# Patient Record
Sex: Female | Born: 1975 | Race: White | Hispanic: No | Marital: Married | Smoking: Former smoker
Health system: Southern US, Community
[De-identification: ages and names within clinical notes are randomized; demographics above are authoritative.]

## PROBLEM LIST (undated history)

## (undated) DIAGNOSIS — B009 Herpesviral infection, unspecified: Secondary | ICD-10-CM

## (undated) DIAGNOSIS — N200 Calculus of kidney: Secondary | ICD-10-CM

## (undated) HISTORY — PX: NO PAST SURGERIES: SHX2092

## (undated) HISTORY — DX: Calculus of kidney: N20.0

## (undated) HISTORY — DX: Herpesviral infection, unspecified: B00.9

---

## 2015-07-10 LAB — HM PAP SMEAR: HM Pap smear: NORMAL

## 2015-10-24 DIAGNOSIS — N3000 Acute cystitis without hematuria: Secondary | ICD-10-CM | POA: Diagnosis not present

## 2015-10-24 DIAGNOSIS — A6000 Herpesviral infection of urogenital system, unspecified: Secondary | ICD-10-CM | POA: Diagnosis not present

## 2015-10-24 DIAGNOSIS — R300 Dysuria: Secondary | ICD-10-CM | POA: Diagnosis not present

## 2015-10-24 DIAGNOSIS — N898 Other specified noninflammatory disorders of vagina: Secondary | ICD-10-CM | POA: Diagnosis not present

## 2017-07-08 ENCOUNTER — Ambulatory Visit (INDEPENDENT_AMBULATORY_CARE_PROVIDER_SITE_OTHER): Payer: BLUE CROSS/BLUE SHIELD | Admitting: Obstetrics and Gynecology

## 2017-07-08 ENCOUNTER — Encounter: Payer: Self-pay | Admitting: Obstetrics and Gynecology

## 2017-07-08 VITALS — BP 128/84 | Ht 63.0 in | Wt 153.0 lb

## 2017-07-08 DIAGNOSIS — Z1331 Encounter for screening for depression: Secondary | ICD-10-CM

## 2017-07-08 DIAGNOSIS — Z01419 Encounter for gynecological examination (general) (routine) without abnormal findings: Secondary | ICD-10-CM | POA: Diagnosis not present

## 2017-07-08 DIAGNOSIS — Z1339 Encounter for screening examination for other mental health and behavioral disorders: Secondary | ICD-10-CM | POA: Diagnosis not present

## 2017-07-08 DIAGNOSIS — N946 Dysmenorrhea, unspecified: Secondary | ICD-10-CM | POA: Diagnosis not present

## 2017-07-08 NOTE — Progress Notes (Signed)
Gynecology Annual Exam   PCP: Patient, No Pcp Per  Chief Complaint  Patient presents with  . Annual Exam   History of Present Illness:  Ms. Cinnamon Morency is a 42 y.o. [id number] who LMP was Patient's last menstrual period was 07/01/2017., presents today for her annual examination.  Her menses are regular every 28-30 days (more recently 3 weeks since her last one), lasting 5 day(s).  Dysmenorrhea none. She does not have intermenstrual bleeding.  She has migraines during her periods and has migraines.   She is single partner, contraception - none.  Last Pap: 07/10/2015  Results were: no abnormalities /neg HPV DNA negative Hx of STDs: HSV  Last mammogram: several years ago  Results were: normal--routine follow-up in 12 months There is no FH of breast cancer. There is no FH of ovarian cancer. The patient does not do self-breast exams.  Tobacco use: The patient denies current or previous tobacco use. Alcohol use: social drinker Exercise: moderately active  The patient wears seatbelts: yes.      Past Medical History:  Diagnosis Date  . Herpes simplex    Past Surgical History: denies  Prior to Admission medications   denies   Allergies: No Known Allergies  Gynecologic History: Patient's last menstrual period was 07/01/2017.  Obstetric History: [id number]  Social History   Socioeconomic History  . Marital status: Married    Spouse name: Not on file  . Number of children: Not on file  . Years of education: Not on file  . Highest education level: Not on file  Social Needs  . Financial resource strain: Not on file  . Food insecurity - worry: Not on file  . Food insecurity - inability: Not on file  . Transportation needs - medical: Not on file  . Transportation needs - non-medical: Not on file  Occupational History  . Not on file  Tobacco Use  . Smoking status: Never Smoker  . Smokeless tobacco: Never Used  Substance and Sexual Activity  . Alcohol use: Yes  . Drug  use: No  . Sexual activity: Yes    Birth control/protection: None  Other Topics Concern  . Not on file  Social History Narrative  . Not on file    Family History  Problem Relation Age of Onset  . Skin cancer Paternal Grandfather     Review of Systems  Constitutional: Negative.   HENT: Negative.   Eyes: Negative.   Respiratory: Negative.   Cardiovascular: Negative.   Gastrointestinal: Negative.   Genitourinary: Negative.   Musculoskeletal: Negative.   Skin: Negative.   Neurological: Negative.   Psychiatric/Behavioral: Negative.      Physical Exam BP 128/84   Ht 5\' 3"  (1.6 m)   Wt 153 lb (69.4 kg)   LMP 07/01/2017   BMI 27.10 kg/m    Physical Exam  Constitutional: She is oriented to person, place, and time. She appears well-developed and well-nourished. No distress.  Genitourinary: Uterus normal. Pelvic exam was performed with patient supine. There is no rash, tenderness, lesion or injury on the right labia. There is no rash, tenderness, lesion or injury on the left labia. No erythema, tenderness or bleeding in the vagina. No signs of injury around the vagina. No vaginal discharge found. Right adnexum does not display mass, does not display tenderness and does not display fullness. Left adnexum does not display mass, does not display tenderness and does not display fullness. Cervix does not exhibit motion tenderness, lesion, discharge or polyp.  Uterus is mobile and anteverted. Uterus is not enlarged, tender or exhibiting a mass.  HENT:  Head: Normocephalic and atraumatic.  Eyes: EOM are normal. No scleral icterus.  Neck: Normal range of motion. Neck supple. No thyromegaly present.  Cardiovascular: Normal rate and regular rhythm. Exam reveals no gallop and no friction rub.  No murmur heard. Pulmonary/Chest: Effort normal and breath sounds normal. No respiratory distress. She has no wheezes. She has no rales. Right breast exhibits no inverted nipple, no mass, no nipple  discharge, no skin change and no tenderness. Left breast exhibits no inverted nipple, no mass, no nipple discharge, no skin change and no tenderness.  Abdominal: Soft. Bowel sounds are normal. She exhibits no distension and no mass. There is no tenderness. There is no rebound and no guarding.  Musculoskeletal: Normal range of motion. She exhibits no edema or tenderness.  Lymphadenopathy:    She has no cervical adenopathy.       Right: No inguinal adenopathy present.       Left: No inguinal adenopathy present.  Neurological: She is alert and oriented to person, place, and time. No cranial nerve deficit.  Skin: Skin is warm and dry. No rash noted. No erythema.  Psychiatric: She has a normal mood and affect. Her behavior is normal. Judgment normal.    Female chaperone present for pelvic and breast  portions of the physical exam  Results: AUDIT Questionnaire (screen for alcoholism): 3 PHQ-9: 10 (0 on #9)   Assessment: 42 y.o. [id number] female here for routine annual gynecologic examination.  Plan: Problem List Items Addressed This Visit    None      Screening: -- Blood pressure screen normal -- Colonoscopy - not due -- Mammogram - due. Patient to call [facility] to arrange. She understands that it is her responsibility to arrange this. She should do this once she turns 42.  -- Weight screening: overweight: continue to monitor -- Depression screening negative (PHQ-9) -- Nutrition: normal -- cholesterol screening: not due for screening -- osteoporosis screening: not due -- tobacco screening: not using -- alcohol screening: AUDIT questionnaire indicates low-risk usage. -- family history of breast cancer screening: done. not at high risk. -- no evidence of domestic violence or intimate partner violence. -- STD screening: gonorrhea/chlamydia NAAT not collected per patient request. -- pap smear not collected per ASCCP guidelines -- flu vaccine declined. Does not normally get -- HPV  vaccination series: not eligilbe  Dysmenorrhea: discussed treatment options. She does not want anything with hormones (estrogen particularly). DIscussed the mirena IUD. She declines for now, but will consider it.   Thomasene MohairStephen Mckenzey Parcell, MD 07/08/2017 11:03 AM

## 2017-08-01 DIAGNOSIS — R1031 Right lower quadrant pain: Secondary | ICD-10-CM | POA: Diagnosis not present

## 2017-08-01 DIAGNOSIS — Z3202 Encounter for pregnancy test, result negative: Secondary | ICD-10-CM | POA: Diagnosis not present

## 2017-08-01 DIAGNOSIS — R300 Dysuria: Secondary | ICD-10-CM | POA: Diagnosis not present

## 2017-08-01 DIAGNOSIS — G44029 Chronic cluster headache, not intractable: Secondary | ICD-10-CM | POA: Diagnosis not present

## 2017-08-02 ENCOUNTER — Emergency Department: Payer: BLUE CROSS/BLUE SHIELD

## 2017-08-02 ENCOUNTER — Other Ambulatory Visit: Payer: Self-pay

## 2017-08-02 ENCOUNTER — Encounter: Payer: Self-pay | Admitting: Emergency Medicine

## 2017-08-02 ENCOUNTER — Emergency Department
Admission: EM | Admit: 2017-08-02 | Discharge: 2017-08-02 | Disposition: A | Discharge status: Home / Self Care | Payer: BLUE CROSS/BLUE SHIELD | Attending: Emergency Medicine | Admitting: Emergency Medicine

## 2017-08-02 DIAGNOSIS — R1031 Right lower quadrant pain: Secondary | ICD-10-CM | POA: Insufficient documentation

## 2017-08-02 DIAGNOSIS — R102 Pelvic and perineal pain: Secondary | ICD-10-CM | POA: Diagnosis not present

## 2017-08-02 DIAGNOSIS — N200 Calculus of kidney: Secondary | ICD-10-CM

## 2017-08-02 DIAGNOSIS — R109 Unspecified abdominal pain: Secondary | ICD-10-CM

## 2017-08-02 DIAGNOSIS — R110 Nausea: Secondary | ICD-10-CM | POA: Diagnosis not present

## 2017-08-02 HISTORY — DX: Calculus of kidney: N20.0

## 2017-08-02 LAB — COMPREHENSIVE METABOLIC PANEL
ALT: 26 U/L (ref 14–54)
AST: 31 U/L (ref 15–41)
Albumin: 4.6 g/dL (ref 3.5–5.0)
Alkaline Phosphatase: 67 U/L (ref 38–126)
Anion gap: 8 (ref 5–15)
BUN: 18 mg/dL (ref 6–20)
CO2: 26 mmol/L (ref 22–32)
Calcium: 9.3 mg/dL (ref 8.9–10.3)
Chloride: 103 mmol/L (ref 101–111)
Creatinine, Ser: 0.68 mg/dL (ref 0.44–1.00)
Glucose, Bld: 100 mg/dL — ABNORMAL HIGH (ref 65–99)
POTASSIUM: 3.9 mmol/L (ref 3.5–5.1)
Sodium: 137 mmol/L (ref 135–145)
Total Bilirubin: 0.5 mg/dL (ref 0.3–1.2)
Total Protein: 7.7 g/dL (ref 6.5–8.1)

## 2017-08-02 LAB — URINALYSIS, COMPLETE (UACMP) WITH MICROSCOPIC
BACTERIA UA: NONE SEEN
BILIRUBIN URINE: NEGATIVE
Glucose, UA: NEGATIVE mg/dL
Hgb urine dipstick: NEGATIVE
KETONES UR: NEGATIVE mg/dL
Nitrite: NEGATIVE
PROTEIN: NEGATIVE mg/dL
Specific Gravity, Urine: 1.021 (ref 1.005–1.030)
pH: 5 (ref 5.0–8.0)

## 2017-08-02 LAB — CBC
HEMATOCRIT: 41.5 % (ref 35.0–47.0)
Hemoglobin: 14 g/dL (ref 12.0–16.0)
MCH: 29.9 pg (ref 26.0–34.0)
MCHC: 33.8 g/dL (ref 32.0–36.0)
MCV: 88.5 fL (ref 80.0–100.0)
Platelets: 241 10*3/uL (ref 150–440)
RBC: 4.69 MIL/uL (ref 3.80–5.20)
RDW: 14.1 % (ref 11.5–14.5)
WBC: 6.3 10*3/uL (ref 3.6–11.0)

## 2017-08-02 LAB — LIPASE, BLOOD: Lipase: 34 U/L (ref 11–51)

## 2017-08-02 LAB — POCT PREGNANCY, URINE: Preg Test, Ur: NEGATIVE

## 2017-08-02 MED ORDER — ONDANSETRON 4 MG PO TBDP: 4.0000 mg | ORAL_TABLET | Freq: Three times a day (TID) | ORAL | 0 refills | Status: DC | PRN

## 2017-08-02 MED ORDER — DIPHENHYDRAMINE HCL 50 MG/ML IJ SOLN
25.0000 mg | Freq: Once | INTRAMUSCULAR | Status: AC
  Administered 2017-08-02: 25 mg via INTRAVENOUS

## 2017-08-02 MED ORDER — METHYLPREDNISOLONE SODIUM SUCC 125 MG IJ SOLR
INTRAMUSCULAR | Status: AC
  Administered 2017-08-02: 125 mg via INTRAVENOUS
  Filled 2017-08-02: qty 2

## 2017-08-02 MED ORDER — DIPHENHYDRAMINE HCL 50 MG/ML IJ SOLN
INTRAMUSCULAR | Status: AC
  Administered 2017-08-02: 25 mg via INTRAVENOUS
  Filled 2017-08-02: qty 1

## 2017-08-02 MED ORDER — METHYLPREDNISOLONE SODIUM SUCC 125 MG IJ SOLR
125.0000 mg | Freq: Once | INTRAMUSCULAR | Status: AC
  Administered 2017-08-02: 125 mg via INTRAVENOUS

## 2017-08-02 MED ORDER — IOPAMIDOL (ISOVUE-300) INJECTION 61%
100.0000 mL | Freq: Once | INTRAVENOUS | Status: AC | PRN
  Administered 2017-08-02: 100 mL via INTRAVENOUS

## 2017-08-02 NOTE — ED Notes (Signed)
Pt to CT via wheel chair with CT Tech

## 2017-08-02 NOTE — Discharge Instructions (Addendum)
Please follow-up with your primary care physician for further evaluation of your abdominal pain.

## 2017-08-02 NOTE — ED Triage Notes (Addendum)
Pt reports urinary urgency and frequency x 2 weeks; seen at urgent care earlier today and urine was clean; pt says pain now to right lower quadrant and right lower back; provider [facility] but when pt got there they had no order; pt says she was feeling some better so went home; pt here tonight as pain continues; c/o urinary urgency;

## 2017-08-02 NOTE — ED Provider Notes (Signed)
[facility] Emergency Department Provider Note   ____________________________________________   First MD Initiated Contact with Patient 08/02/17 [phone]     (approximate)  I have reviewed the triage vital signs and the nursing notes.   HISTORY  Chief Complaint Abdominal Pain    HPI Paula Arias is a 42 y.o. female who comes into the hospital today with some right lower quadrant abdominal pain.  The patient states that she thought she had a UTI.  She is been having some urinary frequency and some flank pain.  She went to urgent care and they dipped her urine it was negative.  She reports that she was uncomfortable in her right lower quadrant with some back pain so they sent her to [facility] to receive a CT scan but the doctor did not call in any orders.  The patient went home and reports that she tried to go to bed but she was uncomfortable while laying flat.  She also was having some cold sweats and some nausea so she decided to come in to get checked out here.  The patient rates her pain a 6 out of 10 in intensity currently.  Her last menstrual period was March 21 and it was normal.  The patient feels like she has a urinary tract infection but she does have a history of kidney stones.  She is here today for evaluation of her symptoms.   Past Medical History:  Diagnosis Date  . Herpes simplex     There are no active problems to display for this patient.   History reviewed. No pertinent surgical history.  Prior to Admission medications   Medication Sig Start Date End Date Taking? Authorizing Provider  ondansetron (ZOFRAN ODT) 4 MG disintegrating tablet Take 1 tablet (4 mg total) by mouth every 8 (eight) hours as needed for nausea or vomiting. 08/02/17   Rebecka Apley, MD    Allergies Isovue [iopamidol]  Family History  Problem Relation Age of Onset  . Skin cancer Paternal Grandfather     Social History Social History   Tobacco  Use  . Smoking status: Never Smoker  . Smokeless tobacco: Never Used  Substance Use Topics  . Alcohol use: Yes  . Drug use: No    Review of Systems  Constitutional: Cold sweats Eyes: No visual changes. ENT: No sore throat. Cardiovascular: Denies chest pain. Respiratory: Denies shortness of breath. Gastrointestinal: abdominal pain.   nausea, no vomiting.  No diarrhea.  No constipation. Genitourinary: Negative for dysuria. Musculoskeletal: Negative for back pain. Skin: Negative for rash. Neurological: Negative for headaches, focal weakness or numbness.   ____________________________________________   PHYSICAL EXAM:  VITAL SIGNS: ED Triage Vitals  Enc Vitals Group     BP 08/02/17 0215 (!) 152/99     Pulse Rate 08/02/17 0215 86     Resp 08/02/17 0215 16     Temp 08/02/17 0215 97.7 F (36.5 C)     Temp Source 08/02/17 0215 Oral     SpO2 08/02/17 0215 100 %     Weight 08/02/17 0215 150 lb (68 kg)     Height 08/02/17 0215 5\' 3"  (1.6 m)     Head Circumference --      Peak Flow --      Pain Score 08/02/17 0225 6     Pain Loc --      Pain Edu? --      Excl. in GC? --     Constitutional: Alert and  oriented. Well appearing and in mild distress. Eyes: Conjunctivae are normal. PERRL. EOMI. Head: Atraumatic. Nose: No congestion/rhinnorhea. Mouth/Throat: Mucous membranes are moist.  Oropharynx non-erythematous. Cardiovascular: Normal rate, regular rhythm. Grossly normal heart sounds.  Good peripheral circulation. Respiratory: Normal respiratory effort.  No retractions. Lungs CTAB. Gastrointestinal: Soft with some Rlq abd pain. No distention.  Positive bowel sounds Genitourinary: Patient declined pelvic exam as had one 2 weeks ago. Musculoskeletal: No lower extremity tenderness nor edema.   Neurologic:  Normal speech and language.  Skin:  Skin is warm, dry and intact.  Psychiatric: Mood and affect are normal.   ____________________________________________   LABS (all  labs ordered are listed, but only abnormal results are displayed)  Labs Reviewed  COMPREHENSIVE METABOLIC PANEL - Abnormal; Notable for the following components:      Result Value   Glucose, Bld 100 (*)    All other components within normal limits  URINALYSIS, COMPLETE (UACMP) WITH MICROSCOPIC - Abnormal; Notable for the following components:   Color, Urine YELLOW (*)    APPearance HAZY (*)    Leukocytes, UA TRACE (*)    Squamous Epithelial / LPF 0-5 (*)    All other components within normal limits  CBC  LIPASE, BLOOD  POC URINE PREG, ED  POCT PREGNANCY, URINE   ____________________________________________  EKG  none ____________________________________________  RADIOLOGY  ED MD interpretation:  CT abdomen and pelvis: Small nonobstructing right renal calculi, no hydronephrosis, No small bowel obstruction, normal appendix, A 1.5 cm dominant right ovarian follicle  Ultrasound pelvis: Physiologic follicles in the ovaries, no pelvic mass beyond physiologic follicles, no ovarian torsion on either side.  Official radiology report(s): US Pelvis Transvanginal Non-ob (tv Only)  Result Date: 08/02/2017 CLINICAL DATA:  Pelvic pain EXAM: TRANSABDOMINAL AND TRANSVAGINAL ULTRASOUND OF PELVIS DOPPLER ULTRASOUND OF OVARIES TECHNIQUE: Study was performed transabdominally to optimize pelvic field of view evaluation and transvaginally to optimize internal visceral architecture evaluation. Color and duplex Doppler ultrasound was utilized to evaluate blood flow to the ovaries. COMPARISON:  CT abdomen and pelvis August 02, 2017 FINDINGS: Uterus Measurements: 8.1 x 4.6 x 4.8 cm. No fibroids or other mass visualized. Endometrium Thickness: 8 mm.  No focal abnormality visualized. Right ovary Measurements: 2.9 x 2.5 x 2.0 cm. There is a 2.0 x 1.6 x 1.1 cm presumed dominant follicle right ovary. No other extrauterine pelvic or adnexal mass on the right. Left ovary Measurements: 2.9 x 1.5 x 2.0 cm. Normal  appearance/no adnexal mass. There is a 1 x 1 cm follicle in left ovary. Pulsed Doppler evaluation of both ovaries demonstrates normal low-resistance arterial and venous waveforms. Other findings No abnormal free fluid. IMPRESSION: Physiologic follicles in the ovaries. No pelvic mass beyond physiologic follicles. No ovarian torsion on either side. Uterus and endometrium appear normal. No inflammatory foci evident. Electronically Signed   By: Bretta Bang III M.D.   On: 08/02/2017 07:25   US Pelvis Complete  Result Date: 08/02/2017 CLINICAL DATA:  Pelvic pain EXAM: TRANSABDOMINAL AND TRANSVAGINAL ULTRASOUND OF PELVIS DOPPLER ULTRASOUND OF OVARIES TECHNIQUE: Study was performed transabdominally to optimize pelvic field of view evaluation and transvaginally to optimize internal visceral architecture evaluation. Color and duplex Doppler ultrasound was utilized to evaluate blood flow to the ovaries. COMPARISON:  CT abdomen and pelvis August 02, 2017 FINDINGS: Uterus Measurements: 8.1 x 4.6 x 4.8 cm. No fibroids or other mass visualized. Endometrium Thickness: 8 mm.  No focal abnormality visualized. Right ovary Measurements: 2.9 x 2.5 x 2.0 cm. There is a  2.0 x 1.6 x 1.1 cm presumed dominant follicle right ovary. No other extrauterine pelvic or adnexal mass on the right. Left ovary Measurements: 2.9 x 1.5 x 2.0 cm. Normal appearance/no adnexal mass. There is a 1 x 1 cm follicle in left ovary. Pulsed Doppler evaluation of both ovaries demonstrates normal low-resistance arterial and venous waveforms. Other findings No abnormal free fluid. IMPRESSION: Physiologic follicles in the ovaries. No pelvic mass beyond physiologic follicles. No ovarian torsion on either side. Uterus and endometrium appear normal. No inflammatory foci evident. Electronically Signed   By: Bretta BangWilliam  Woodruff III M.D.   On: 08/02/2017 07:25   Ct Abdomen Pelvis W Contrast  Result Date: 08/02/2017 CLINICAL DATA:  42 year old female with abdominal  pain. Concern for acute appendicitis. EXAM: CT ABDOMEN AND PELVIS WITH CONTRAST TECHNIQUE: Multidetector CT imaging of the abdomen and pelvis was performed using the standard protocol following bolus administration of intravenous contrast. CONTRAST:  100mL ISOVUE-300 IOPAMIDOL (ISOVUE-300) INJECTION 61% COMPARISON:  None. FINDINGS: Lower chest: The visualized lung bases are clear. No intra-abdominal free air or free fluid. Hepatobiliary: No focal liver abnormality is seen. No gallstones, gallbladder wall thickening, or biliary dilatation. Pancreas: Unremarkable. No pancreatic ductal dilatation or surrounding inflammatory changes. Spleen: Normal in size without focal abnormality. Adrenals/Urinary Tract: The adrenal glands are unremarkable. There is a 3 cm right renal cyst. Punctate nonobstructing right renal interpolar stone as well as a 3 mm nonobstructing inferior pole calculus. There is no hydronephrosis. The left kidney is unremarkable. The visualized ureters and urinary bladder appear unremarkable. Stomach/Bowel: There is no bowel obstruction or active inflammation. Normal appendix. Vascular/Lymphatic: No significant vascular findings are present. No enlarged abdominal or pelvic lymph nodes. Reproductive: The uterus is anteverted and grossly unremarkable. There is a 1.5 cm right ovarian dominant follicle. The left ovary is unremarkable. Other: None Musculoskeletal: No acute or significant osseous findings. IMPRESSION: 1. Small nonobstructing right renal calculi.  No hydronephrosis. 2. No bowel obstruction or active inflammation.  Normal appendix. 3. A 1.5 cm dominant right ovarian follicle. Electronically Signed   By: Elgie CollardArash  Radparvar M.D.   On: 08/02/2017 05:53   [place] Abdominal Pelvic Art/vent Flow Doppler  Result Date: 08/02/2017 CLINICAL DATA:  Pelvic pain EXAM: TRANSABDOMINAL AND TRANSVAGINAL ULTRASOUND OF PELVIS DOPPLER ULTRASOUND OF OVARIES TECHNIQUE: Study was performed transabdominally to optimize  pelvic field of view evaluation and transvaginally to optimize internal visceral architecture evaluation. Color and duplex Doppler ultrasound was utilized to evaluate blood flow to the ovaries. COMPARISON:  CT abdomen and pelvis August 02, 2017 FINDINGS: Uterus Measurements: 8.1 x 4.6 x 4.8 cm. No fibroids or other mass visualized. Endometrium Thickness: 8 mm.  No focal abnormality visualized. Right ovary Measurements: 2.9 x 2.5 x 2.0 cm. There is a 2.0 x 1.6 x 1.1 cm presumed dominant follicle right ovary. No other extrauterine pelvic or adnexal mass on the right. Left ovary Measurements: 2.9 x 1.5 x 2.0 cm. Normal appearance/no adnexal mass. There is a 1 x 1 cm follicle in left ovary. Pulsed Doppler evaluation of both ovaries demonstrates normal low-resistance arterial and venous waveforms. Other findings No abnormal free fluid. IMPRESSION: Physiologic follicles in the ovaries. No pelvic mass beyond physiologic follicles. No ovarian torsion on either side. Uterus and endometrium appear normal. No inflammatory foci evident. Electronically Signed   By: Bretta BangWilliam  Woodruff III M.D.   On: 08/02/2017 07:25    ____________________________________________   PROCEDURES  Procedure(s) performed: None  Procedures  Critical Care performed: No  ____________________________________________   INITIAL  IMPRESSION / ASSESSMENT AND PLAN / ED COURSE  As part of my medical decision making, I reviewed the following data within the electronic MEDICAL RECORD NUMBER Notes from prior ED visits and  Controlled Substance Database   This is a 42 year old female who comes into the hospital today with some right lower quadrant pain and concern that she may have a urinary tract infection.  My differential diagnosis includes urinary tract infection, kidney stone, appendicitis, ovarian cyst.  We did check some blood work on the patient to include a CBC, CMP, urinalysis.  The patient also had a lipase which was negative.  I sent  the patient for a CT scan which showed no bowel obstruction and a normal appendix.  The patient does have a 1.5 cm dominant right ovarian follicle.  The patient was sent for an ultrasound to evaluate her ovaries as well.  She will be reassessed.    The patient's workup is unremarkable.  She will be discharged home and encouraged to follow-up with her primary care physician as well as her OB/GYN.  She has no further questions or concerns.  ____________________________________________   FINAL CLINICAL IMPRESSION(S) / ED DIAGNOSES  Final diagnoses:  Abdominal pain  Abdominal pain     ED Discharge Orders        Ordered    ondansetron (ZOFRAN ODT) 4 MG disintegrating tablet  Every 8 hours PRN     08/02/17 0748       Note:  This document was prepared using Dragon voice recognition software and may include unintentional dictation errors.    Rebecka Apley, MD 08/02/17 [phone]

## 2017-08-02 NOTE — ED Notes (Addendum)
Pt called out to RN that is was itching and noted redness and rash to her face. Small red bumps noted to face. Pt denies shortness of breath or swelling in her mouth/airway region Pt noted with redness to her face, neck and chest and arms. Dr Zenda AlpersWebster informed.

## 2017-08-02 NOTE — ED Notes (Signed)
Pt returned to room from CT

## 2017-12-10 ENCOUNTER — Other Ambulatory Visit: Payer: Self-pay

## 2017-12-10 ENCOUNTER — Encounter: Payer: Self-pay | Admitting: Certified Nurse Midwife

## 2017-12-10 ENCOUNTER — Other Ambulatory Visit (HOSPITAL_COMMUNITY)
Admission: RE | Admit: 2017-12-10 | Discharge: 2017-12-10 | Disposition: A | Discharge status: Home / Self Care | Payer: BLUE CROSS/BLUE SHIELD | Source: Ambulatory Visit | Attending: Certified Nurse Midwife | Admitting: Certified Nurse Midwife

## 2017-12-10 ENCOUNTER — Ambulatory Visit (INDEPENDENT_AMBULATORY_CARE_PROVIDER_SITE_OTHER): Payer: BLUE CROSS/BLUE SHIELD | Admitting: Certified Nurse Midwife

## 2017-12-10 VITALS — BP 114/72 | HR 107 | Ht 63.0 in | Wt 150.0 lb

## 2017-12-10 DIAGNOSIS — N898 Other specified noninflammatory disorders of vagina: Secondary | ICD-10-CM

## 2017-12-10 DIAGNOSIS — B9689 Other specified bacterial agents as the cause of diseases classified elsewhere: Secondary | ICD-10-CM

## 2017-12-10 DIAGNOSIS — N760 Acute vaginitis: Secondary | ICD-10-CM | POA: Diagnosis not present

## 2017-12-10 MED ORDER — SECNIDAZOLE 2 G PO PACK: 2.0000 g | PACK | Freq: Once | ORAL | 0 refills | Status: DC

## 2017-12-10 MED ORDER — TINIDAZOLE 500 MG PO TABS: 1000.0000 mg | ORAL_TABLET | Freq: Every day | ORAL | 0 refills | Status: AC

## 2017-12-10 NOTE — Progress Notes (Signed)
Obstetrics & Gynecology Office Visit   Chief Complaint:  Chief Complaint  Patient presents with  . Gynecologic Exam    d/c w/odor x 1.5 mos. Tried OTC for BV. Retained Tampon?    History of Present Illness: 42 year old married WF G2 P2 with LMP 12/03/2017 presents with complaints of vaginal odor x 1.5 mos. She has noticed a clear to sometimes yellow green discharge. No dysuria. Feels hot and cold, but has not had a fever. Had some spotting after IC last week (also started menses last week). Uses withdrawal for birth control. Has used some OTC inserts for the odor without relief.  No history of frequent vaginitis. Has a history of dyspareunia and dysmenorrhea. Current form of birth control-withdrawal.   Review of Systems:  Review of Systems  Constitutional: Positive for chills (feels hot and cold) and malaise/fatigue. Negative for fever and weight loss.  HENT: Negative for congestion, sinus pain and sore throat.   Eyes: Negative for blurred vision and pain.  Respiratory: Negative for hemoptysis, shortness of breath and wheezing.   Cardiovascular: Negative for chest pain, palpitations and leg swelling.  Gastrointestinal: Positive for abdominal pain (right upper-mid abdomen), diarrhea, nausea and vomiting. Negative for blood in stool and heartburn.  Genitourinary: Negative for dysuria, frequency, hematuria and urgency.       Positive for dyspareunia, vaginal discharge and vaginal odor  Musculoskeletal: Negative for back pain, joint pain and myalgias.  Skin: Negative for itching and rash.  Neurological: Positive for tingling, weakness (muscular weakness) and headaches. Negative for dizziness.  Endo/Heme/Allergies: Negative for environmental allergies and polydipsia. Does not bruise/bleed easily.       Negative for hirsutism   Psychiatric/Behavioral: Positive for depression. The patient is nervous/anxious. The patient does not have insomnia.      Past Medical History:  Past Medical  History:  Diagnosis Date  . Herpes simplex    positive HSV2 IGG  . Kidney stone 08/02/2017    Past Surgical History:  No past surgical history on file.  Gynecologic History: Patient's last menstrual period was 12/03/2017.  Obstetric History: [id number]  Family History:  Family History  Problem Relation Age of Onset  . Skin cancer Paternal Grandfather     Social History:  Social History   Socioeconomic History  . Marital status: Married    Spouse name: Not on file  . Number of children: 2  . Years of education: Not on file  . Highest education level: Not on file  Occupational History  . Not on file  Social Needs  . Financial resource strain: Not on file  . Food insecurity:    Worry: Not on file    Inability: Not on file  . Transportation needs:    Medical: Not on file    Non-medical: Not on file  Tobacco Use  . Smoking status: Former Games developer  . Smokeless tobacco: Never Used  Substance and Sexual Activity  . Alcohol use: Yes  . Drug use: No  . Sexual activity: Yes    Partners: Male    Birth control/protection: Coitus interruptus  Lifestyle  . Physical activity:    Days per week: Not on file    Minutes per session: Not on file  . Stress: Not on file  Relationships  . Social connections:    Talks on phone: Not on file    Gets together: Not on file    Attends religious service: Not on file    Active member of club or  organization: Not on file    Attends meetings of clubs or organizations: Not on file    Relationship status: Not on file  . Intimate partner violence:    Fear of current or ex partner: Not on file    Emotionally abused: Not on file    Physically abused: Not on file    Forced sexual activity: Not on file  Other Topics Concern  . Not on file  Social History Narrative  . Not on file    Allergies:  Allergies  Allergen Reactions  . Isovue [Iopamidol] Hives, Itching and Rash    Medications: Current Outpatient Medications on File Prior to  Visit  Medication Sig Dispense Refill  . ondansetron (ZOFRAN ODT) 4 MG disintegrating tablet Take 1 tablet (4 mg total) by mouth every 8 (eight) hours as needed for nausea or vomiting. 20 tablet 0   No current facility-administered medications on file prior to visit.       Physical Exam Vitals:BP 114/72 (BP Location: Right Arm, Patient Position: Sitting, Cuff Size: Normal)   Pulse (!) 107   Ht 5\' 3"  (1.6 m)   Wt 150 lb (68 kg)   LMP 12/03/2017   BMI 26.57 kg/m     Physical Exam  Constitutional: She is oriented to person, place, and time. She appears well-developed and well-nourished. No distress.  Respiratory: Effort normal.  GI: Soft. She exhibits no distension and no mass. There is no tenderness. There is no guarding.  Genitourinary:  Genitourinary Comments: Vulva: no inflammation or lesions Vagina: white homogenous discharge, tender vaginal wall when obtaining swab Cervix: friable with doing cervical swab, tender to touch with swab Uterus: NSSC, tender, mobile, MP; uterus positoned deep in pelvis. Unable to evaluate cul de sac Adnexa: no masses, NT  Neurological: She is alert and oriented to person, place, and time.  Skin: Skin is warm and dry.  Psychiatric:  anxious     Assessment: 42 y.o. [id number] bacterial vaginosis  Plan: GC/Chlamydia PCR Tindamax 1 Gm daily x 5 days (with food, no alcohol) RTO prn persistent or worsening symptoms   Farrel Connersolleen Kauri Garson, CNM

## 2017-12-11 ENCOUNTER — Encounter: Payer: Self-pay | Admitting: Certified Nurse Midwife

## 2017-12-11 LAB — POCT WET PREP (WET MOUNT): Trichomonas Wet Prep HPF POC: ABSENT

## 2017-12-13 LAB — CERVICOVAGINAL ANCILLARY ONLY
CHLAMYDIA, DNA PROBE: NEGATIVE
Neisseria Gonorrhea: NEGATIVE

## 2018-05-31 ENCOUNTER — Encounter: Payer: Self-pay | Admitting: Obstetrics and Gynecology

## 2018-05-31 ENCOUNTER — Ambulatory Visit (INDEPENDENT_AMBULATORY_CARE_PROVIDER_SITE_OTHER): Payer: BC Managed Care – PPO | Admitting: Obstetrics and Gynecology

## 2018-05-31 ENCOUNTER — Other Ambulatory Visit (HOSPITAL_COMMUNITY)
Admission: RE | Admit: 2018-05-31 | Discharge: 2018-05-31 | Disposition: A | Discharge status: Home / Self Care | Payer: BLUE CROSS/BLUE SHIELD | Source: Ambulatory Visit | Attending: Obstetrics and Gynecology | Admitting: Obstetrics and Gynecology

## 2018-05-31 VITALS — BP 124/80 | HR 65 | Ht 63.0 in | Wt 150.0 lb

## 2018-05-31 DIAGNOSIS — B9689 Other specified bacterial agents as the cause of diseases classified elsewhere: Secondary | ICD-10-CM | POA: Diagnosis not present

## 2018-05-31 DIAGNOSIS — N898 Other specified noninflammatory disorders of vagina: Secondary | ICD-10-CM

## 2018-05-31 DIAGNOSIS — N760 Acute vaginitis: Secondary | ICD-10-CM

## 2018-05-31 DIAGNOSIS — Z113 Encounter for screening for infections with a predominantly sexual mode of transmission: Secondary | ICD-10-CM | POA: Insufficient documentation

## 2018-05-31 LAB — POCT WET PREP WITH KOH
Clue Cells Wet Prep HPF POC: POSITIVE
KOH Prep POC: POSITIVE — AB
TRICHOMONAS UA: NEGATIVE
Yeast Wet Prep HPF POC: NEGATIVE

## 2018-05-31 MED ORDER — METRONIDAZOLE 500 MG PO TABS: 500.0000 mg | ORAL_TABLET | Freq: Two times a day (BID) | ORAL | 0 refills | Status: AC

## 2018-05-31 NOTE — Patient Instructions (Signed)
I value your feedback and entrusting us with your care. If you get a [facility] patient survey, I would appreciate you taking the time to let us know about your experience today. Thank you! 

## 2018-05-31 NOTE — Progress Notes (Signed)
Patient, No Pcp Per   Chief Complaint  Patient presents with  . Bacterial Vaginosis    discharge with odor, mild itching, no irritation, says it burns before she urinates not while urinating x a couple weeks    HPI:      Ms. Paula Arias is a 43 y.o. [id number] who LMP was Patient's last menstrual period was 05/20/2018 (exact date)., presents today for increased d/c, odor and slight itch for a couple wks. Treated for BV with tindazole 8/19 with sx relief, but pt concerned that sx have returned. Husband noticing odor during sex. Pt concerned about infidelity of spouse and wants STD testing. No fevers, LBP, belly pain. Having dysuria with vag sx, but no other UTI sx. Uses ivory soap and dryer sheets.    Past Medical History:  Diagnosis Date  . Herpes simplex    positive HSV2 IGG  . Kidney stone 08/02/2017    History reviewed. No pertinent surgical history.  Family History  Problem Relation Age of Onset  . Skin cancer Paternal Grandfather     Social History   Socioeconomic History  . Marital status: Married    Spouse name: Not on file  . Number of children: 2  . Years of education: Not on file  . Highest education level: Not on file  Occupational History  . Not on file  Social Needs  . Financial resource strain: Not on file  . Food insecurity:    Worry: Not on file    Inability: Not on file  . Transportation needs:    Medical: Not on file    Non-medical: Not on file  Tobacco Use  . Smoking status: Former Games developermoker  . Smokeless tobacco: Never Used  Substance and Sexual Activity  . Alcohol use: Yes  . Drug use: No  . Sexual activity: Yes    Partners: Male    Birth control/protection: Coitus interruptus  Lifestyle  . Physical activity:    Days per week: Not on file    Minutes per session: Not on file  . Stress: Not on file  Relationships  . Social connections:    Talks on phone: Not on file    Gets together: Not on file    Attends religious service:  Not on file    Active member of club or organization: Not on file    Attends meetings of clubs or organizations: Not on file    Relationship status: Not on file  . Intimate partner violence:    Fear of current or ex partner: Not on file    Emotionally abused: Not on file    Physically abused: Not on file    Forced sexual activity: Not on file  Other Topics Concern  . Not on file  Social History Narrative  . Not on file    Outpatient Medications Prior to Visit  Medication Sig Dispense Refill  . ondansetron (ZOFRAN ODT) 4 MG disintegrating tablet Take 1 tablet (4 mg total) by mouth every 8 (eight) hours as needed for nausea or vomiting. 20 tablet 0   No facility-administered medications prior to visit.       ROS:  Review of Systems  Constitutional: Negative for fever.  Gastrointestinal: Negative for blood in stool, constipation, diarrhea, nausea and vomiting.  Genitourinary: Positive for dyspareunia, dysuria, frequency and vaginal discharge. Negative for flank pain, hematuria, urgency, vaginal bleeding and vaginal pain.  Musculoskeletal: Negative for back pain.  Skin: Negative for rash.    OBJECTIVE:  Vitals:  BP 124/80   Pulse 65   Ht 5\' 3"  (1.6 m)   Wt 150 lb (68 kg)   LMP 05/20/2018 (Exact Date)   BMI 26.57 kg/m   Physical Exam Vitals signs reviewed.  Constitutional:      Appearance: She is well-developed.  Pulmonary:     Effort: Pulmonary effort is normal.  Genitourinary:    Pubic Area: No rash.      Labia:        Right: No rash, tenderness or lesion.        Left: No rash, tenderness or lesion.      Vagina: Vaginal discharge present. No erythema or tenderness.     Cervix: Normal.     Uterus: Normal. Not enlarged and not tender.      Adnexa: Right adnexa normal and left adnexa normal.       Right: No mass or tenderness.         Left: No mass or tenderness.    Musculoskeletal: Normal range of motion.  Neurological:     Mental Status: She is alert and  oriented to person, place, and time.  Psychiatric:        Behavior: Behavior normal.        Thought Content: Thought content normal.     Results: Results for orders placed or performed in visit on 05/31/18 (from the past 24 hour(s))  POCT Wet Prep with KOH     Status: Abnormal   Collection Time: 05/31/18 11:46 AM  Result Value Ref Range   Trichomonas, UA Negative    Clue Cells Wet Prep HPF POC pos    Epithelial Wet Prep HPF POC     Yeast Wet Prep HPF POC neg    Bacteria Wet Prep HPF POC     RBC Wet Prep HPF POC     WBC Wet Prep HPF POC     KOH Prep POC Positive (A) Negative     Assessment/Plan: Bacterial vaginosis - Pos sx/wet prep. Rx flagyl. No EtOH. Will RF if sx recur. Add probiotics/dove sens skin soap.  - Plan: metroNIDAZOLE (FLAGYL) 500 MG tablet, POCT Wet Prep with KOH  Vaginal discharge - Plan: Cervicovaginal ancillary only, POCT Wet Prep with KOH  Screening for STD (sexually transmitted disease) - Will call with results. - Plan: Cervicovaginal ancillary only    Meds ordered this encounter  Medications  . metroNIDAZOLE (FLAGYL) 500 MG tablet    Sig: Take 1 tablet (500 mg total) by mouth 2 (two) times daily for 7 days.    Dispense:  14 tablet    Refill:  0    Order Specific Question:   Supervising Provider    Answer:   Nadara Mustard [[id number]]      Return if symptoms worsen or fail to improve.  Shantale Holtmeyer B. Ronell Duffus, PA-C 05/31/2018 11:48 AM

## 2018-06-01 LAB — CERVICOVAGINAL ANCILLARY ONLY
Chlamydia: NEGATIVE
Neisseria Gonorrhea: NEGATIVE
Trichomonas: NEGATIVE

## 2018-06-01 NOTE — Progress Notes (Signed)
Pls let pt know STD testing neg.

## 2018-06-02 NOTE — Progress Notes (Signed)
Pt aware.

## 2018-11-01 ENCOUNTER — Other Ambulatory Visit: Payer: Self-pay | Admitting: Obstetrics and Gynecology

## 2018-11-01 DIAGNOSIS — Z1231 Encounter for screening mammogram for malignant neoplasm of breast: Secondary | ICD-10-CM

## 2018-11-08 ENCOUNTER — Other Ambulatory Visit: Payer: Self-pay

## 2018-11-08 ENCOUNTER — Ambulatory Visit
Admission: RE | Admit: 2018-11-08 | Discharge: 2018-11-08 | Disposition: A | Discharge status: Home / Self Care | Payer: BC Managed Care – PPO | Source: Ambulatory Visit | Attending: Obstetrics and Gynecology | Admitting: Obstetrics and Gynecology

## 2018-11-08 DIAGNOSIS — Z1231 Encounter for screening mammogram for malignant neoplasm of breast: Secondary | ICD-10-CM

## 2018-11-15 ENCOUNTER — Other Ambulatory Visit (HOSPITAL_COMMUNITY)
Admission: RE | Admit: 2018-11-15 | Discharge: 2018-11-15 | Disposition: A | Discharge status: Home / Self Care | Payer: BC Managed Care – PPO | Source: Ambulatory Visit | Attending: Obstetrics and Gynecology | Admitting: Obstetrics and Gynecology

## 2018-11-15 ENCOUNTER — Other Ambulatory Visit: Payer: Self-pay

## 2018-11-15 ENCOUNTER — Ambulatory Visit (INDEPENDENT_AMBULATORY_CARE_PROVIDER_SITE_OTHER): Payer: BC Managed Care – PPO | Admitting: Obstetrics and Gynecology

## 2018-11-15 ENCOUNTER — Encounter: Payer: Self-pay | Admitting: Obstetrics and Gynecology

## 2018-11-15 VITALS — BP 124/78 | Ht 63.0 in | Wt 156.0 lb

## 2018-11-15 DIAGNOSIS — Z1331 Encounter for screening for depression: Secondary | ICD-10-CM

## 2018-11-15 DIAGNOSIS — N760 Acute vaginitis: Secondary | ICD-10-CM

## 2018-11-15 DIAGNOSIS — Z01419 Encounter for gynecological examination (general) (routine) without abnormal findings: Secondary | ICD-10-CM

## 2018-11-15 DIAGNOSIS — L74510 Primary focal hyperhidrosis, axilla: Secondary | ICD-10-CM

## 2018-11-15 DIAGNOSIS — B9689 Other specified bacterial agents as the cause of diseases classified elsewhere: Secondary | ICD-10-CM

## 2018-11-15 DIAGNOSIS — Z124 Encounter for screening for malignant neoplasm of cervix: Secondary | ICD-10-CM | POA: Diagnosis not present

## 2018-11-15 DIAGNOSIS — Z1339 Encounter for screening examination for other mental health and behavioral disorders: Secondary | ICD-10-CM

## 2018-11-15 MED ORDER — XERAC AC 6.25 % EX SOLN: CUTANEOUS | 1 refills | Status: AC

## 2018-11-15 MED ORDER — METRONIDAZOLE 500 MG PO TABS: 500.0000 mg | ORAL_TABLET | Freq: Two times a day (BID) | ORAL | 0 refills | Status: AC

## 2018-11-15 NOTE — Progress Notes (Signed)
Gynecology Annual Exam  PCP: Patient, No Pcp Per  Chief Complaint  Patient presents with  . Annual Exam   History of Present Illness:  Ms. Kristen LoaderDoree Maranda Jacob is a 43 y.o. [id number] who LMP was Patient's last menstrual period was 10/18/2018., presents today for her annual examination.  Her menses are regular every 28-30 days, lasting 3 day(s).  Dysmenorrhea none. She does not have intermenstrual bleeding. She does have migraines with her menses.   She is unsure whether she hast hot flashes or not. She just stays hot, especially in the summer.   She is sexually active. She uses nothing for contraception.  She does not have vaginal dryness.  Last Pap: 07/10/2015  Results were: no abnormalities /neg HPV DNA.  Hx of STDs: HSV  Last mammogram: 11/08/2018  Results: have not been read yet. She states that they were going to compare this one to a prior one.  There is no FH of breast cancer. There is no FH of ovarian cancer. The patient does do self-breast exams.  Colonoscopy: n/a DEXA: has not been screened for osteoporosis  Tobacco use: The patient denies current or previous tobacco use. Alcohol use: social drinker Exercise: moderately active  The patient wears seatbelts: yes.  She states that she has been diagnosed with BV twice in the past year.    She was evaluated in 12/2017 and was treated for BV.  She was then treated in 05/2018.  She was doing ok since that time, but about a month ago she started having the increase in discharge.  However, she does not have the associated odor, like previously.  She denies itching, burning, and other vaginal irritative symptoms.    She also states that she has to use anti-perspirant multiple times per day.  This is usually the case for her in the summer.  Past Medical History:  Diagnosis Date  . Herpes simplex    positive HSV2 IGG  . Kidney stone 08/02/2017    Past Surgical History:  Procedure Laterality Date  . NO PAST SURGERIES      Prior to  Admission medications   Denies   Allergies  Allergen Reactions  . Isovue [Iopamidol] Hives, Itching and Rash    Obstetric History: [id number]  Family History  Problem Relation Age of Onset  . Skin cancer Paternal Grandfather   . Breast cancer Neg Hx     Social History   Socioeconomic History  . Marital status: Married    Spouse name: Not on file  . Number of children: 2  . Years of education: Not on file  . Highest education level: Not on file  Occupational History  . Not on file  Social Needs  . Financial resource strain: Not on file  . Food insecurity    Worry: Not on file    Inability: Not on file  . Transportation needs    Medical: Not on file    Non-medical: Not on file  Tobacco Use  . Smoking status: Former Games developermoker  . Smokeless tobacco: Never Used  Substance and Sexual Activity  . Alcohol use: Yes  . Drug use: No  . Sexual activity: Yes    Partners: Male    Birth control/protection: Coitus interruptus, None  Lifestyle  . Physical activity    Days per week: Not on file    Minutes per session: Not on file  . Stress: Not on file  Relationships  . Social connections    Talks on phone: Not on  file    Gets together: Not on file    Attends religious service: Not on file    Active member of club or organization: Not on file    Attends meetings of clubs or organizations: Not on file    Relationship status: Not on file  . Intimate partner violence    Fear of current or ex partner: Not on file    Emotionally abused: Not on file    Physically abused: Not on file    Forced sexual activity: Not on file  Other Topics Concern  . Not on file  Social History Narrative  . Not on file    Review of Systems  Constitutional: Negative.   HENT: Negative.   Eyes: Negative.   Respiratory: Negative.   Cardiovascular: Negative.   Gastrointestinal: Negative.   Genitourinary: Negative.   Musculoskeletal: Negative.   Skin: Negative.   Neurological: Negative.    Psychiatric/Behavioral: Negative.      Physical Exam BP 124/78   Ht 5\' 3"  (1.6 m)   Wt 156 lb (70.8 kg)   LMP 10/18/2018   BMI 27.63 kg/m   Physical Exam Constitutional:      General: She is not in acute distress.    Appearance: Normal appearance. She is well-developed.  Genitourinary:     Pelvic exam was performed with patient supine.     Vulva, urethra, bladder and uterus normal.     No inguinal adenopathy present in the right or left side.    No signs of injury in the vagina.     No vaginal discharge, erythema, tenderness or bleeding.     No cervical motion tenderness, discharge, lesion or polyp.     Uterus is mobile.     Uterus is not enlarged or tender.     No uterine mass detected.    Uterus is anteverted.     No right or left adnexal mass present.     Right adnexa not tender or full.     Left adnexa not tender or full.  HENT:     Head: Normocephalic and atraumatic.  Eyes:     General: No scleral icterus.    Conjunctiva/sclera: Conjunctivae normal.  Neck:     Musculoskeletal: Normal range of motion and neck supple.     Thyroid: No thyromegaly.  Cardiovascular:     Rate and Rhythm: Normal rate and regular rhythm.     Heart sounds: No murmur. No friction rub. No gallop.   Pulmonary:     Effort: Pulmonary effort is normal. No respiratory distress.     Breath sounds: Normal breath sounds. No wheezing or rales.  Chest:     Breasts: Tanner Score is 5.        Right: Normal. No inverted nipple, mass, nipple discharge, skin change or tenderness.        Left: Normal. No inverted nipple, mass, nipple discharge, skin change or tenderness.  Abdominal:     General: Bowel sounds are normal. There is no distension.     Palpations: Abdomen is soft. There is no mass.     Tenderness: There is no abdominal tenderness. There is no guarding or rebound.  Musculoskeletal: Normal range of motion.        General: No swelling or tenderness.  Lymphadenopathy:     Cervical: No  cervical adenopathy.     Upper Body:     Right upper body: No supraclavicular adenopathy.     Left upper body: No supraclavicular adenopathy.  Lower Body: No right inguinal adenopathy. No left inguinal adenopathy.  Neurological:     General: No focal deficit present.     Mental Status: She is alert and oriented to person, place, and time.     Cranial Nerves: No cranial nerve deficit.  Skin:    General: Skin is warm and dry.     Findings: No erythema or rash.  Psychiatric:        Mood and Affect: Mood normal.        Behavior: Behavior normal.        Judgment: Judgment normal.   Female chaperone present for pelvic and breast  portions of the physical exam  Results: AUDIT Questionnaire (screen for alcoholism): 2 PHQ-9: 2  Wet Prep: Clue Cells: Positive Fungal elements: Negative Trichomonas: morphology consistent. Not definitive.    Assessment: 43 y.o. 282P2002 female here for routine gynecologic examination.  Plan: Problem List Items Addressed This Visit    None    Visit Diagnoses    Women's annual routine gynecological examination    -  Primary   Relevant Medications   Aluminum Chloride in Alcohol (XERAC AC) 6.25 % SOLN   metroNIDAZOLE (FLAGYL) 500 MG tablet   Other Relevant Orders   Cytology - PAP   Screening for depression       Screening for alcoholism       Pap smear for cervical cancer screening       Relevant Orders   Cytology - PAP   Hyperhidrosis of axilla       Relevant Medications   Aluminum Chloride in Alcohol (XERAC AC) 6.25 % SOLN   Bacterial vaginosis       Relevant Medications   metroNIDAZOLE (FLAGYL) 500 MG tablet     Screening: -- Blood pressure screen normal -- Colonoscopy - not due -- Mammogram - not due -- Weight screening: normal -- Depression screening negative (PHQ-9) -- Nutrition: normal -- cholesterol screening: not due for screening -- osteoporosis screening: not due -- tobacco screening: not using -- alcohol screening: AUDIT  questionnaire indicates low-risk usage. -- family history of breast cancer screening: done. not at high risk. -- no evidence of domestic violence or intimate partner violence. -- STD screening: gonorrhea/chlamydia NAAT collected trichomonas based on suspicious findings on wet prep, though not definitive -- pap smear collected per ASCCP guidelines   Thomasene MohairStephen Ulanda Tackett, MD 11/15/2018 5:12 PM

## 2018-11-18 ENCOUNTER — Encounter: Payer: Self-pay | Admitting: Obstetrics and Gynecology

## 2018-11-18 ENCOUNTER — Other Ambulatory Visit: Payer: Self-pay | Admitting: Obstetrics and Gynecology

## 2018-11-18 DIAGNOSIS — R928 Other abnormal and inconclusive findings on diagnostic imaging of breast: Secondary | ICD-10-CM

## 2018-11-18 DIAGNOSIS — N6310 Unspecified lump in the right breast, unspecified quadrant: Secondary | ICD-10-CM

## 2018-11-18 DIAGNOSIS — N6320 Unspecified lump in the left breast, unspecified quadrant: Secondary | ICD-10-CM

## 2018-11-18 LAB — CYTOLOGY - PAP
Diagnosis: NEGATIVE
HPV: NOT DETECTED
Trichomonas: NEGATIVE

## 2018-11-18 NOTE — Telephone Encounter (Signed)
Spoke with pt. Had Cat 0 mammos. Waiting to sched addl images. Mammo done 7/7/ but final reading not until 7/16.

## 2018-11-24 ENCOUNTER — Ambulatory Visit
Admission: RE | Admit: 2018-11-24 | Discharge: 2018-11-24 | Disposition: A | Discharge status: Home / Self Care | Payer: BC Managed Care – PPO | Source: Ambulatory Visit | Attending: Obstetrics and Gynecology | Admitting: Obstetrics and Gynecology

## 2018-11-24 DIAGNOSIS — R928 Other abnormal and inconclusive findings on diagnostic imaging of breast: Secondary | ICD-10-CM

## 2018-11-24 DIAGNOSIS — N6001 Solitary cyst of right breast: Secondary | ICD-10-CM | POA: Diagnosis not present

## 2018-11-24 DIAGNOSIS — N6002 Solitary cyst of left breast: Secondary | ICD-10-CM | POA: Diagnosis not present

## 2018-11-24 DIAGNOSIS — R922 Inconclusive mammogram: Secondary | ICD-10-CM | POA: Diagnosis not present

## 2018-11-24 DIAGNOSIS — N6012 Diffuse cystic mastopathy of left breast: Secondary | ICD-10-CM | POA: Diagnosis not present

## 2018-11-24 DIAGNOSIS — N6011 Diffuse cystic mastopathy of right breast: Secondary | ICD-10-CM | POA: Diagnosis not present

## 2018-11-24 DIAGNOSIS — N6310 Unspecified lump in the right breast, unspecified quadrant: Secondary | ICD-10-CM

## 2018-11-24 DIAGNOSIS — N6320 Unspecified lump in the left breast, unspecified quadrant: Secondary | ICD-10-CM | POA: Diagnosis not present

## 2018-11-25 ENCOUNTER — Encounter: Payer: Self-pay | Admitting: Obstetrics and Gynecology

## 2019-11-21 ENCOUNTER — Ambulatory Visit (INDEPENDENT_AMBULATORY_CARE_PROVIDER_SITE_OTHER): Payer: 59 | Admitting: Obstetrics and Gynecology

## 2019-11-21 ENCOUNTER — Encounter: Payer: Self-pay | Admitting: Obstetrics and Gynecology

## 2019-11-21 ENCOUNTER — Other Ambulatory Visit: Payer: Self-pay

## 2019-11-21 VITALS — BP 120/90 | Ht 63.0 in | Wt 156.0 lb

## 2019-11-21 DIAGNOSIS — Z800 Family history of malignant neoplasm of digestive organs: Secondary | ICD-10-CM | POA: Diagnosis not present

## 2019-11-21 DIAGNOSIS — E049 Nontoxic goiter, unspecified: Secondary | ICD-10-CM | POA: Diagnosis not present

## 2019-11-21 DIAGNOSIS — Z1329 Encounter for screening for other suspected endocrine disorder: Secondary | ICD-10-CM

## 2019-11-21 DIAGNOSIS — Z01419 Encounter for gynecological examination (general) (routine) without abnormal findings: Secondary | ICD-10-CM

## 2019-11-21 DIAGNOSIS — Z1231 Encounter for screening mammogram for malignant neoplasm of breast: Secondary | ICD-10-CM | POA: Diagnosis not present

## 2019-11-21 NOTE — Patient Instructions (Signed)
I value your feedback and entrusting us with your care. If you get a [facility] patient survey, I would appreciate you taking the time to let us know about your experience today. Thank you! ° °As of April 13, 2019, your lab results will be released to your MyChart immediately, before I even have a chance to see them. Please give me time to review them and contact you if there are any abnormalities. Thank you for your patience.  ° °[facility]: [phone] ° ° ° °

## 2019-11-21 NOTE — Progress Notes (Signed)
PCP:  Patient, No Pcp Per   Chief Complaint  Patient presents with  . Gynecologic Exam     HPI:      Ms. Paula Arias is a 44 y.o. G2P2002 whose LMP was No LMP recorded., presents today for her annual examination.  Her menses are regular every 28-30 days, lasting 3-5 days.  Dysmenorrhea none. She does not have intermenstrual bleeding. Has menstrual migraines.  Sex activity: single partner, contraception - withdrawal. Declines other BC. Hx of dyspareunia--no change in sx.  Last Pap: 11/15/18  Results were: no abnormalities /neg HPV DNA  Hx of STDs: HSV Hx of recurrent BV. Pt frustrated. Has changed soaps, no dryer sheets. Doing yogurt daily, has tried repHresh. Sex and her period are triggers. Wearing non cotton underwear and cheeky or thong underwear. Hasn't tried boric acid supp yet.  Last mammogram: 11/24/18  Results were: normal--routine follow-up in 12 months. Has benign breast cysts bilat. There is no FH of breast cancer. There is no FH of ovarian cancer. The patient does not do self-breast exams. There is a FH pancreatic with colon cancer mets in her MGF, genetic testing not done  Tobacco use: vapes daily, plans to quit Alcohol use: none No drug use.  Exercise: moderately active  She does get adequate calcium but not Vitamin D in her diet.  Has had throat discomfort the last few days.    Past Medical History:  Diagnosis Date  . Herpes simplex    positive HSV2 IGG  . Kidney stone 08/02/2017    Past Surgical History:  Procedure Laterality Date  . NO PAST SURGERIES      Family History  Problem Relation Age of Onset  . Skin cancer Paternal Grandfather   . Pancreatic cancer Maternal Grandfather 58  . Colon cancer Maternal Grandfather 9  . Breast cancer Neg Hx     Social History   Socioeconomic History  . Marital status: Married    Spouse name: Not on file  . Number of children: 2  . Years of education: Not on file  . Highest education level: Not  on file  Occupational History  . Not on file  Tobacco Use  . Smoking status: Former Research scientist (life sciences)  . Smokeless tobacco: Never Used  Vaping Use  . Vaping Use: Never used  Substance and Sexual Activity  . Alcohol use: Yes  . Drug use: No  . Sexual activity: Yes    Partners: Male    Birth control/protection: Coitus interruptus, None  Other Topics Concern  . Not on file  Social History Narrative  . Not on file   Social Determinants of Health   Financial Resource Strain:   . Difficulty of Paying Living Expenses:   Food Insecurity:   . Worried About Charity fundraiser in the Last Year:   . Arboriculturist in the Last Year:   Transportation Needs:   . Film/video editor (Medical):   Marland Kitchen Lack of Transportation (Non-Medical):   Physical Activity:   . Days of Exercise per Week:   . Minutes of Exercise per Session:   Stress:   . Feeling of Stress :   Social Connections:   . Frequency of Communication with Friends and Family:   . Frequency of Social Gatherings with Friends and Family:   . Attends Religious Services:   . Active Member of Clubs or Organizations:   . Attends Archivist Meetings:   Marland Kitchen Marital Status:   Intimate Production manager  Violence:   . Fear of Current or Ex-Partner:   . Emotionally Abused:   Marland Kitchen Physically Abused:   . Sexually Abused:      Current Outpatient Medications:  .  Aluminum Chloride in Alcohol (XERAC AC) 6.25 % SOLN, Apply nightly to underarms, wash off in morning. May use gradually less often once achieve desired effect, Disp: 35 mL, Rfl: 1     ROS:  Review of Systems  Constitutional: Negative for fatigue, fever and unexpected weight change.  Respiratory: Negative for cough, shortness of breath and wheezing.   Cardiovascular: Negative for chest pain, palpitations and leg swelling.  Gastrointestinal: Negative for blood in stool, constipation, diarrhea, nausea and vomiting.  Endocrine: Negative for cold intolerance, heat intolerance and  polyuria.  Genitourinary: Positive for vaginal discharge. Negative for dyspareunia, dysuria, flank pain, frequency, genital sores, hematuria, menstrual problem, pelvic pain, urgency, vaginal bleeding and vaginal pain.  Musculoskeletal: Negative for back pain, joint swelling and myalgias.  Skin: Negative for rash.  Neurological: Positive for headaches. Negative for dizziness, syncope, light-headedness and numbness.  Hematological: Negative for adenopathy.  Psychiatric/Behavioral: Negative for agitation, confusion, sleep disturbance and suicidal ideas. The patient is not nervous/anxious.    BREAST: No symptoms   Objective: BP 120/90   Ht '5\' 3"'  (1.6 m)   Wt 156 lb (70.8 kg)   BMI 27.63 kg/m    Physical Exam Constitutional:      Appearance: She is well-developed.  Genitourinary:     Vulva, vagina, cervix, uterus, right adnexa and left adnexa normal.     No vulval lesion or tenderness noted.     No vaginal discharge, erythema or tenderness.     No cervical polyp.     Uterus is not enlarged or tender.     No right or left adnexal mass present.     Right adnexa not tender.     Left adnexa not tender.  Neck:     Thyroid: Thyromegaly present.  Cardiovascular:     Rate and Rhythm: Normal rate and regular rhythm.     Heart sounds: Normal heart sounds. No murmur heard.   Pulmonary:     Effort: Pulmonary effort is normal.     Breath sounds: Normal breath sounds.  Chest:     Breasts:        Right: No mass, nipple discharge, skin change or tenderness.        Left: No mass, nipple discharge, skin change or tenderness.  Abdominal:     Palpations: Abdomen is soft.     Tenderness: There is no abdominal tenderness. There is no guarding.  Musculoskeletal:        General: Normal range of motion.     Cervical back: Normal range of motion.  Neurological:     General: No focal deficit present.     Mental Status: She is alert and oriented to person, place, and time.     Cranial Nerves: No  cranial nerve deficit.  Skin:    General: Skin is warm and dry.  Psychiatric:        Mood and Affect: Mood normal.        Behavior: Behavior normal.        Thought Content: Thought content normal.        Judgment: Judgment normal.  Vitals reviewed.      Assessment/Plan: Encounter for annual routine gynecological examination  Encounter for screening mammogram for malignant neoplasm of breast - Plan: MM 3D SCREEN BREAST BILATERAL; pt to  sched mammo  Family history of pancreatic cancer - Plan: Integrated [facility] ([facility]); MyRisk testing discussed and pt would like to do. Pt to RTO for labs when has thyroid drawn, per her pref. Will call with results.   Enlarged thyroid - Plan: TSH + free T4; Check labs (too late today to do them). If neg, will check u/s. Pt having some throat discomfort recently.  Thyroid disorder screening - Plan: TSH + free T4  BV--add boric acid supp, cotton, non-thong underwear. [email or web address]. May need wkly maintenance tx. F/u prn.            GYN counsel breast self exam, mammography screening, adequate intake of calcium and vitamin D, diet and exercise     F/U  Return in about 2 days (around 11/23/2019) for [facility] lab appt/MyRisk lab./ 1 yr annual  [place] B. Kerim Statzer, PA-C 11/21/2019 5:09 PM

## 2019-11-22 ENCOUNTER — Other Ambulatory Visit: Payer: 59

## 2019-11-22 ENCOUNTER — Other Ambulatory Visit: Payer: Self-pay

## 2019-11-23 LAB — TSH+FREE T4
Free T4: 1.11 ng/dL (ref 0.82–1.77)
TSH: 2.59 u[IU]/mL (ref 0.450–4.500)

## 2019-11-23 NOTE — Addendum Note (Signed)
Addended by: Althea Grimmer B on: 11/23/2019 08:14 AM   Modules accepted: Orders

## 2019-11-24 ENCOUNTER — Telehealth: Payer: Self-pay | Admitting: Obstetrics and Gynecology

## 2019-11-24 NOTE — Telephone Encounter (Signed)
Patient is schedule for thyroid ultrasound for  Thursday, 11/30/19 the out patient imaging center at [address], [place], [place] [address] arrival is 3:45pm with no resctrictions. Unable to leave voicemail due to it being full. I will trying to contact via mychart.

## 2019-11-30 ENCOUNTER — Ambulatory Visit: Payer: 59

## 2019-12-03 DIAGNOSIS — Z1371 Encounter for nonprocreative screening for genetic disease carrier status: Secondary | ICD-10-CM

## 2019-12-03 DIAGNOSIS — Z800 Family history of malignant neoplasm of digestive organs: Secondary | ICD-10-CM

## 2019-12-03 HISTORY — DX: Encounter for nonprocreative screening for genetic disease carrier status: Z13.71

## 2019-12-03 HISTORY — DX: Family history of malignant neoplasm of digestive organs: Z80.0

## 2019-12-07 ENCOUNTER — Encounter: Payer: Self-pay | Admitting: Obstetrics and Gynecology

## 2020-01-03 ENCOUNTER — Encounter: Payer: Self-pay | Admitting: Obstetrics and Gynecology

## 2020-01-17 NOTE — Telephone Encounter (Signed)
UTR pt several times with MyRisk results. Mailbox is always full, never heard back from pt after sending MyChart message. Letter will be sent with hard copy of results.   LETTER:  I have tried to reach you several times regarding your cancer genetic testing results from July 2021. Your tests show you are negative for 35 genes related to breast, ovarian, pancreatic and colon cancers. This doesn't mean you will never get any of these cancers, but you don't have a genetic mutation that significantly increases your risk of these cancers. No further screening or evaluation is needed at this time.  I am including your copy of these results for your records. You have access to a genetic counselor at [facility] (at no charge to you) if you would like to discuss this further (phone number circled on lab results) or you can call me at the office.   Please let me know if you have any further questions after reviewing your results.

## 2020-09-20 ENCOUNTER — Ambulatory Visit
Admission: RE | Admit: 2020-09-20 | Discharge: 2020-09-20 | Disposition: A | Discharge status: Home / Self Care | Payer: 59 | Source: Ambulatory Visit | Attending: Obstetrics and Gynecology | Admitting: Obstetrics and Gynecology

## 2020-09-20 ENCOUNTER — Other Ambulatory Visit: Payer: Self-pay

## 2020-09-20 DIAGNOSIS — Z1231 Encounter for screening mammogram for malignant neoplasm of breast: Secondary | ICD-10-CM

## 2020-11-25 ENCOUNTER — Encounter: Payer: Self-pay | Admitting: Obstetrics and Gynecology

## 2020-11-25 ENCOUNTER — Ambulatory Visit (INDEPENDENT_AMBULATORY_CARE_PROVIDER_SITE_OTHER): Payer: 59 | Admitting: Obstetrics and Gynecology

## 2020-11-25 ENCOUNTER — Other Ambulatory Visit: Payer: Self-pay

## 2020-11-25 VITALS — BP 130/82 | Ht 63.0 in | Wt 136.0 lb

## 2020-11-25 DIAGNOSIS — Z01419 Encounter for gynecological examination (general) (routine) without abnormal findings: Secondary | ICD-10-CM | POA: Diagnosis not present

## 2020-11-25 DIAGNOSIS — B9689 Other specified bacterial agents as the cause of diseases classified elsewhere: Secondary | ICD-10-CM | POA: Diagnosis not present

## 2020-11-25 DIAGNOSIS — Z1231 Encounter for screening mammogram for malignant neoplasm of breast: Secondary | ICD-10-CM

## 2020-11-25 DIAGNOSIS — N760 Acute vaginitis: Secondary | ICD-10-CM | POA: Diagnosis not present

## 2020-11-25 MED ORDER — METRONIDAZOLE 0.75 % VA GEL: VAGINAL | 2 refills | Status: AC

## 2020-11-25 NOTE — Progress Notes (Signed)
PCP:  Patient, No Pcp Per (Inactive)   Chief Complaint  Patient presents with   Gynecologic Exam   Vaginal Discharge    Itchiness, some odor on/off BC for the past year, usually after cycle ends     HPI:      Ms. Paula Arias is a 45 y.o. [id number] whose LMP was Patient's last menstrual period was 11/19/2020 (exact date)., presents today for her annual examination.  Her menses are regular every 28-30 days, lasting 4-5 days.  Dysmenorrhea none. She does not have intermenstrual bleeding.   Sex activity: not currently; she and husband separating did do withdrawal.  Hx of dyspareunia Last Pap: 11/15/18  Results were: no abnormalities /neg HPV DNA  Hx of STDs: HSV  Hx of recurrent BV. Pt frustrated. Has changed soaps, no dryer sheets. Doing probiotics daily, has tried repHresh and boric acid supp. Sx start a few days after menses stops and lasts till next period. Has odor, extra d/c and sometimes itching. Sex was also a trigger but no longer active. Wearing cotton underwear, not thongs. Did boric acid supp nightly with some relief.   Last mammogram: 09/20/20 Results were: normal--routine follow-up in 12 months. Has benign breast cysts bilat. There is no FH of breast cancer. There is no FH of ovarian cancer. The patient does not do self-breast exams. There is a FH pancreatic with colon cancer mets in her MGF, pt is MyRisk neg except MUTYH VUS 2021  Tobacco use: vapes socially now, decreased from daily Alcohol use: social No drug use.  Exercise: moderately active  She does get adequate calcium and Vitamin D in her diet.   Past Medical History:  Diagnosis Date   BRCA negative 12/2019   MyRisk except MUTYH VUS   Family history of pancreatic cancer 12/2019   MyRisk neg except MUTYH VUS   Herpes simplex    positive HSV2 IGG   Kidney stone 08/02/2017    Past Surgical History:  Procedure Laterality Date   NO PAST SURGERIES      Family History  Problem Relation Age of  Onset   Skin cancer Paternal Grandfather    Pancreatic cancer Maternal Grandfather 82   Colon cancer Maternal Grandfather 82   Breast cancer Neg Hx     Social History   Socioeconomic History   Marital status: Married    Spouse name: Not on file   Number of children: 2   Years of education: Not on file   Highest education level: Not on file  Occupational History   Not on file  Tobacco Use   Smoking status: Former   Smokeless tobacco: Never  Vaping Use   Vaping Use: Never used  Substance and Sexual Activity   Alcohol use: Yes   Drug use: No   Sexual activity: Not Currently    Partners: Male    Birth control/protection: Coitus interruptus, None  Other Topics Concern   Not on file  Social History Narrative   Not on file   Social Determinants of Health   Financial Resource Strain: Not on file  Food Insecurity: Not on file  Transportation Needs: Not on file  Physical Activity: Not on file  Stress: Not on file  Social Connections: Not on file  Intimate Partner Violence: Not on file     Current Outpatient Medications:    Aluminum Chloride in Alcohol (XERAC AC) 6.25 % SOLN, Apply nightly to underarms, wash off in morning. May use gradually less often once achieve desired  effect, Disp: 35 mL, Rfl: 1   metroNIDAZOLE (METROGEL) 0.75 % vaginal gel, Apply one applicatorful to vagina at bedtime 1-2 times weekly as preventive for 3 months, Disp: 70 g, Rfl: 2     ROS:  Review of Systems  Constitutional:  Negative for fatigue, fever and unexpected weight change.  Respiratory:  Negative for cough, shortness of breath and wheezing.   Cardiovascular:  Negative for chest pain, palpitations and leg swelling.  Gastrointestinal:  Negative for blood in stool, constipation, diarrhea, nausea and vomiting.  Endocrine: Negative for cold intolerance, heat intolerance and polyuria.  Genitourinary:  Positive for vaginal discharge. Negative for dyspareunia, dysuria, flank pain, frequency,  genital sores, hematuria, menstrual problem, pelvic pain, urgency, vaginal bleeding and vaginal pain.  Musculoskeletal:  Negative for back pain, joint swelling and myalgias.  Skin:  Negative for rash.  Neurological:  Negative for dizziness, syncope, light-headedness, numbness and headaches.  Hematological:  Negative for adenopathy.  Psychiatric/Behavioral:  Negative for agitation, confusion, sleep disturbance and suicidal ideas. The patient is not nervous/anxious.   BREAST: No symptoms   Objective: BP 130/82   Ht '5\' 3"'  (1.6 m)   Wt 136 lb (61.7 kg)   LMP 11/19/2020 (Exact Date)   BMI 24.09 kg/m    Physical Exam Constitutional:      Appearance: She is well-developed.  Genitourinary:     Vulva normal.     Right Labia: No rash, tenderness or lesions.    Left Labia: No tenderness, lesions or rash.    No vaginal discharge, erythema or tenderness.      Right Adnexa: not tender and no mass present.    Left Adnexa: not tender and no mass present.    No cervical friability or polyp.     Uterus is not enlarged or tender.  Breasts:    Right: No mass, nipple discharge, skin change or tenderness.     Left: No mass, nipple discharge, skin change or tenderness.  Neck:     Thyroid: No thyromegaly.  Cardiovascular:     Rate and Rhythm: Normal rate and regular rhythm.     Heart sounds: Normal heart sounds. No murmur heard. Pulmonary:     Effort: Pulmonary effort is normal.     Breath sounds: Normal breath sounds.  Abdominal:     Palpations: Abdomen is soft.     Tenderness: There is no abdominal tenderness. There is no guarding or rebound.  Musculoskeletal:        General: Normal range of motion.     Cervical back: Normal range of motion.  Lymphadenopathy:     Cervical: No cervical adenopathy.  Neurological:     General: No focal deficit present.     Mental Status: She is alert and oriented to person, place, and time.     Cranial Nerves: No cranial nerve deficit.  Skin:     General: Skin is warm and dry.  Psychiatric:        Mood and Affect: Mood normal.        Behavior: Behavior normal.        Thought Content: Thought content normal.        Judgment: Judgment normal.  Vitals reviewed.     Assessment/Plan:  Encounter for annual routine gynecological examination  Encounter for screening mammogram for malignant neoplasm of breast - Plan: MM 3D SCREEN BREAST BILATERAL; pt current on mammo  BV (bacterial vaginosis) - Plan: metroNIDAZOLE (METROGEL) 0.75 % vaginal gel; pt doing everything right. Try metrogel 1-2  times wkly for 3 wks each cycle, can do boric acid supp, too. F/u prn.             GYN counsel breast self exam, mammography screening, adequate intake of calcium and vitamin D, diet and exercise     F/U  Return in about 1 year (around 11/25/2021)./ 1 yr annual  [place] B. Atsushi Yom, PA-C 11/25/2020 12:14 PM

## 2020-11-25 NOTE — Patient Instructions (Signed)
I value your feedback and you entrusting us with your care. If you get a [facility] patient survey, I would appreciate you taking the time to let us know about your experience today. Thank you! ? ? ?

## 2021-11-26 ENCOUNTER — Other Ambulatory Visit: Payer: Self-pay | Admitting: Obstetrics and Gynecology

## 2021-11-26 DIAGNOSIS — Z1231 Encounter for screening mammogram for malignant neoplasm of breast: Secondary | ICD-10-CM

## 2022-01-13 ENCOUNTER — Ambulatory Visit (INDEPENDENT_AMBULATORY_CARE_PROVIDER_SITE_OTHER): Payer: Self-pay | Admitting: Obstetrics and Gynecology

## 2022-01-13 ENCOUNTER — Encounter: Payer: Self-pay | Admitting: Obstetrics and Gynecology

## 2022-01-13 ENCOUNTER — Ambulatory Visit
Admission: RE | Admit: 2022-01-13 | Discharge: 2022-01-13 | Disposition: A | Discharge status: Home / Self Care | Payer: 59 | Source: Ambulatory Visit | Attending: Obstetrics and Gynecology | Admitting: Obstetrics and Gynecology

## 2022-01-13 ENCOUNTER — Other Ambulatory Visit (HOSPITAL_COMMUNITY)
Admission: RE | Admit: 2022-01-13 | Discharge: 2022-01-13 | Disposition: A | Discharge status: Home / Self Care | Payer: 59 | Source: Ambulatory Visit | Attending: Obstetrics and Gynecology | Admitting: Obstetrics and Gynecology

## 2022-01-13 VITALS — BP 120/80 | Ht 63.0 in | Wt 135.0 lb

## 2022-01-13 DIAGNOSIS — Z1231 Encounter for screening mammogram for malignant neoplasm of breast: Secondary | ICD-10-CM

## 2022-01-13 DIAGNOSIS — Z13220 Encounter for screening for lipoid disorders: Secondary | ICD-10-CM

## 2022-01-13 DIAGNOSIS — Z1329 Encounter for screening for other suspected endocrine disorder: Secondary | ICD-10-CM

## 2022-01-13 DIAGNOSIS — Z124 Encounter for screening for malignant neoplasm of cervix: Secondary | ICD-10-CM | POA: Insufficient documentation

## 2022-01-13 DIAGNOSIS — Z1151 Encounter for screening for human papillomavirus (HPV): Secondary | ICD-10-CM | POA: Insufficient documentation

## 2022-01-13 DIAGNOSIS — N926 Irregular menstruation, unspecified: Secondary | ICD-10-CM

## 2022-01-13 DIAGNOSIS — Z0000 Encounter for general adult medical examination without abnormal findings: Secondary | ICD-10-CM

## 2022-01-13 DIAGNOSIS — Z01419 Encounter for gynecological examination (general) (routine) without abnormal findings: Secondary | ICD-10-CM

## 2022-01-13 NOTE — Patient Instructions (Signed)
I value your feedback and you entrusting us with your care. If you get a [facility] patient survey, I would appreciate you taking the time to let us know about your experience today. Thank you! ? ? ?

## 2022-01-13 NOTE — Progress Notes (Signed)
PCP:  Patient, No Pcp Per   Chief Complaint  Patient presents with   Gynecologic Exam     HPI:      Ms. Paula Arias is a 46 y.o. [id number] whose LMP was Patient's last menstrual period was 01/07/2022., presents today for her annual examination.  Her menses are regular every 28-30 days (Q3-8 wks earlier this yr), lasting 4-5 days.  Dysmenorrhea mild. She does not have intermenstrual bleeding. Has menstrual migraines.    Sex activity: rarely; she and husband separated; did withdrawal.  Hx of dyspareunia Last Pap: 11/15/18  Results were: no abnormalities /neg HPV DNA  Hx of STDs: HSV  Hx of recurrent BV. No recent sx. Triggered by sex in past.   Last mammogram: today; awaiting results. 5/22  Results were: normal--routine follow-up in 12 months. Has benign breast cysts bilat. There is no FH of breast cancer. There is no FH of ovarian cancer. The patient does not do self-breast exams. There is a FH pancreatic with colon cancer mets in her MGF, pt is MyRisk neg except MUTYH VUS 2021  Tobacco use: stopped vaping Alcohol use: social No drug use.  Exercise: moderately active  She does get adequate calcium but not Vitamin D in her diet. No recent labs.   Past Medical History:  Diagnosis Date   BRCA negative 12/2019   MyRisk except MUTYH VUS   Family history of pancreatic cancer 12/2019   MyRisk neg except MUTYH VUS   Herpes simplex    positive HSV2 IGG   Kidney stone 08/02/2017    Past Surgical History:  Procedure Laterality Date   NO PAST SURGERIES      Family History  Problem Relation Age of Onset   Skin cancer Paternal Grandfather    Pancreatic cancer Maternal Grandfather 82   Colon cancer Maternal Grandfather 82   Breast cancer Neg Hx     Social History   Socioeconomic History   Marital status: Married    Spouse name: Not on file   Number of children: 2   Years of education: Not on file   Highest education level: Not on file  Occupational History    Not on file  Tobacco Use   Smoking status: Former   Smokeless tobacco: Never  Vaping Use   Vaping Use: Never used  Substance and Sexual Activity   Alcohol use: Yes   Drug use: No   Sexual activity: Not Currently    Partners: Male    Birth control/protection: Coitus interruptus, None  Other Topics Concern   Not on file  Social History Narrative   Not on file   Social Determinants of Health   Financial Resource Strain: Not on file  Food Insecurity: Not on file  Transportation Needs: Not on file  Physical Activity: Not on file  Stress: Not on file  Social Connections: Not on file  Intimate Partner Violence: Not on file     Current Outpatient Medications:    Aluminum Chloride in Alcohol (XERAC AC) 6.25 % SOLN, Apply nightly to underarms, wash off in morning. May use gradually less often once achieve desired effect (Patient not taking: Reported on 01/13/2022), Disp: 35 mL, Rfl: 1   metroNIDAZOLE (METROGEL) 0.75 % vaginal gel, Apply one applicatorful to vagina at bedtime 1-2 times weekly as preventive for 3 months (Patient not taking: Reported on 01/13/2022), Disp: 70 g, Rfl: 2     ROS:  Review of Systems  Constitutional:  Negative for fatigue, fever and unexpected  weight change.  Respiratory:  Negative for cough, shortness of breath and wheezing.   Cardiovascular:  Negative for chest pain, palpitations and leg swelling.  Gastrointestinal:  Negative for blood in stool, constipation, diarrhea, nausea and vomiting.  Endocrine: Negative for cold intolerance, heat intolerance and polyuria.  Genitourinary:  Positive for vaginal discharge. Negative for dyspareunia, dysuria, flank pain, frequency, genital sores, hematuria, menstrual problem, pelvic pain, urgency, vaginal bleeding and vaginal pain.  Musculoskeletal:  Negative for back pain, joint swelling and myalgias.  Skin:  Negative for rash.  Neurological:  Negative for dizziness, syncope, light-headedness, numbness and headaches.   Hematological:  Negative for adenopathy.  Psychiatric/Behavioral:  Negative for agitation, confusion, sleep disturbance and suicidal ideas. The patient is not nervous/anxious.    BREAST: No symptoms   Objective: BP 120/80   Ht '5\' 3"'  (1.6 m)   Wt 135 lb (61.2 kg)   LMP 01/07/2022   BMI 23.91 kg/m    Physical Exam Constitutional:      Appearance: She is well-developed.  Genitourinary:     Vulva normal.     Right Labia: No rash, tenderness or lesions.    Left Labia: No tenderness, lesions or rash.    No vaginal discharge, erythema or tenderness.      Right Adnexa: not tender and no mass present.    Left Adnexa: not tender and no mass present.    No cervical friability or polyp.     Uterus is not enlarged or tender.  Breasts:    Right: No mass, nipple discharge, skin change or tenderness.     Left: No mass, nipple discharge, skin change or tenderness.  Neck:     Thyroid: Thyromegaly present.     Comments: ? SLIGHT THYROID ENLARGEMENT Cardiovascular:     Rate and Rhythm: Normal rate and regular rhythm.     Heart sounds: Normal heart sounds. No murmur heard. Pulmonary:     Effort: Pulmonary effort is normal.     Breath sounds: Normal breath sounds.  Abdominal:     Palpations: Abdomen is soft.     Tenderness: There is no abdominal tenderness. There is no guarding or rebound.  Musculoskeletal:        General: Normal range of motion.     Cervical back: Normal range of motion.  Lymphadenopathy:     Cervical: No cervical adenopathy.  Neurological:     General: No focal deficit present.     Mental Status: She is alert and oriented to person, place, and time.     Cranial Nerves: No cranial nerve deficit.  Skin:    General: Skin is warm and dry.  Psychiatric:        Mood and Affect: Mood normal.        Behavior: Behavior normal.        Thought Content: Thought content normal.        Judgment: Judgment normal.  Vitals reviewed.     Assessment/Plan:  Encounter for  annual routine gynecological examination  Cervical cancer screening - Plan: Cytology - PAP  Screening for HPV (human papillomavirus) - Plan: Cytology - PAP  Encounter for screening mammogram for malignant neoplasm of breast; pt current on mammo  Irregular menses - Plan: TSH + free T4; check thyroid labs. If neg, f/u prn AUB. Could have been stress vs perimenopausal sx.   Blood tests for routine general physical examination - Plan: TSH + free T4, Comprehensive metabolic panel, Lipid panel  Screening cholesterol level - Plan: Lipid  panel  Thyroid disorder screening - Plan: TSH + free T4           GYN counsel breast self exam, mammography screening, adequate intake of calcium and vitamin D, diet and exercise     F/U  Return in about 1 year (around 01/14/2023)./ 1 yr annual  [place] B. Pearle Wandler, PA-C 01/13/2022 4:46 PM

## 2022-01-16 LAB — CYTOLOGY - PAP
Comment: NEGATIVE
Diagnosis: NEGATIVE
High risk HPV: NEGATIVE

## 2022-01-19 ENCOUNTER — Other Ambulatory Visit: Payer: Self-pay

## 2023-03-07 IMAGING — MG MM DIGITAL SCREENING BILAT W/ TOMO AND CAD
8 series · 8 of 24 positions shown · non-contrast
Comparison: Previous exam(s).

CLINICAL DATA: Screening. History of benign cysts within each
breast.

EXAM:
DIGITAL SCREENING BILATERAL MAMMOGRAM WITH TOMOSYNTHESIS AND CAD
TECHNIQUE: Bilateral screening digital craniocaudal and mediolateral oblique
mammograms were obtained. Bilateral screening digital breast
tomosynthesis was performed. The images were evaluated with
computer-aided detection.

[L MLO synth-2D]
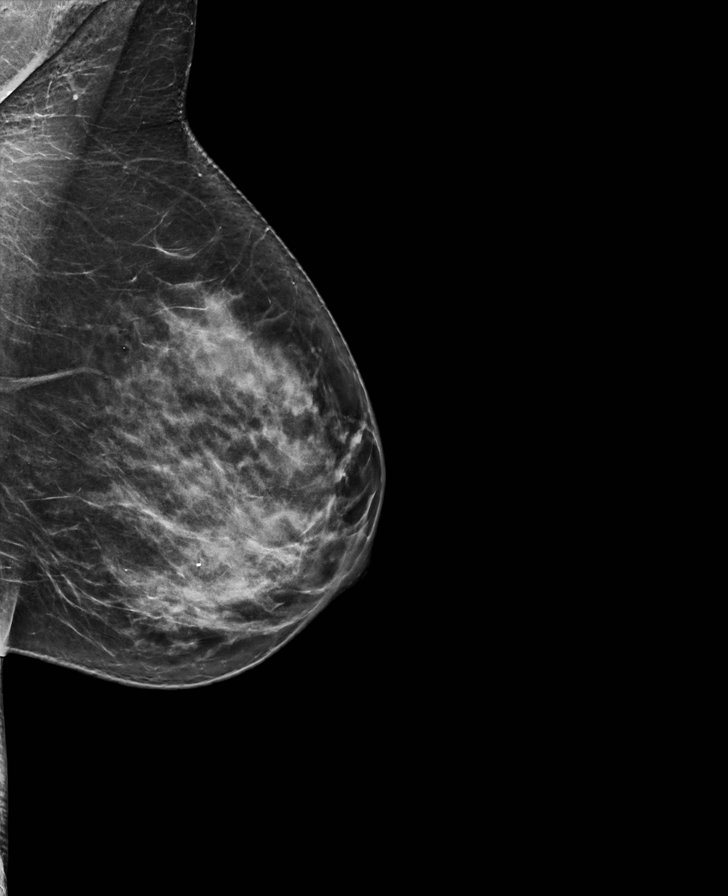

[R MLO synth-2D]
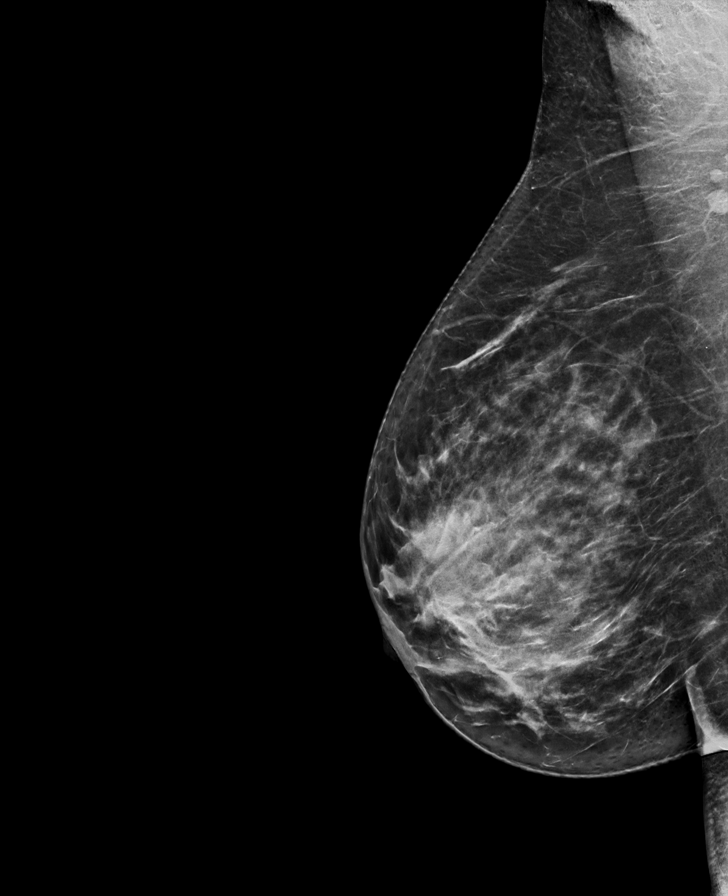

[R CC synth-2D]
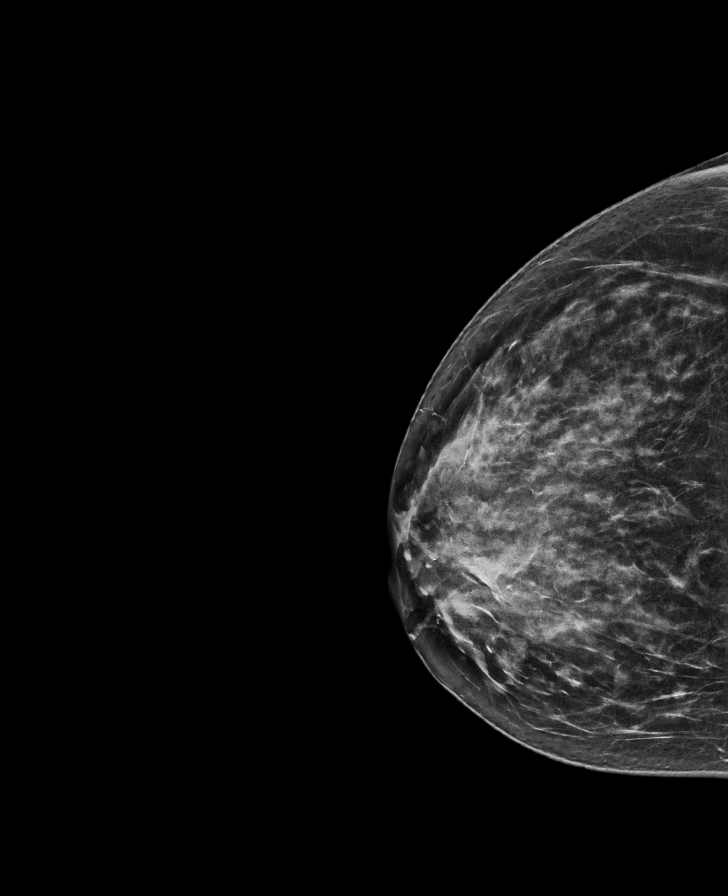

[L CC synth-2D]
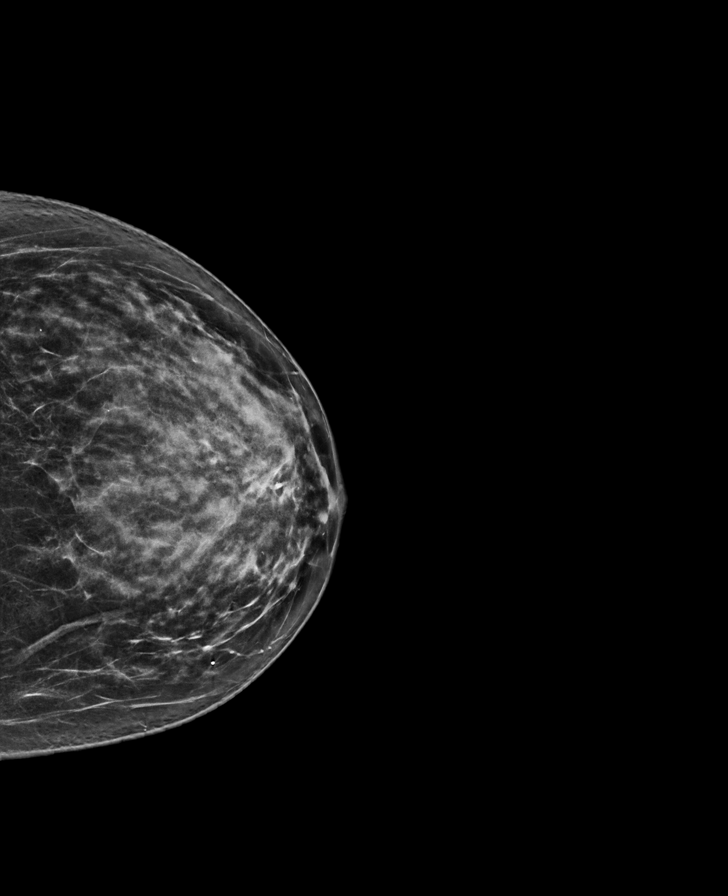

[R MLO tomo · tomo slice 37/73.0]
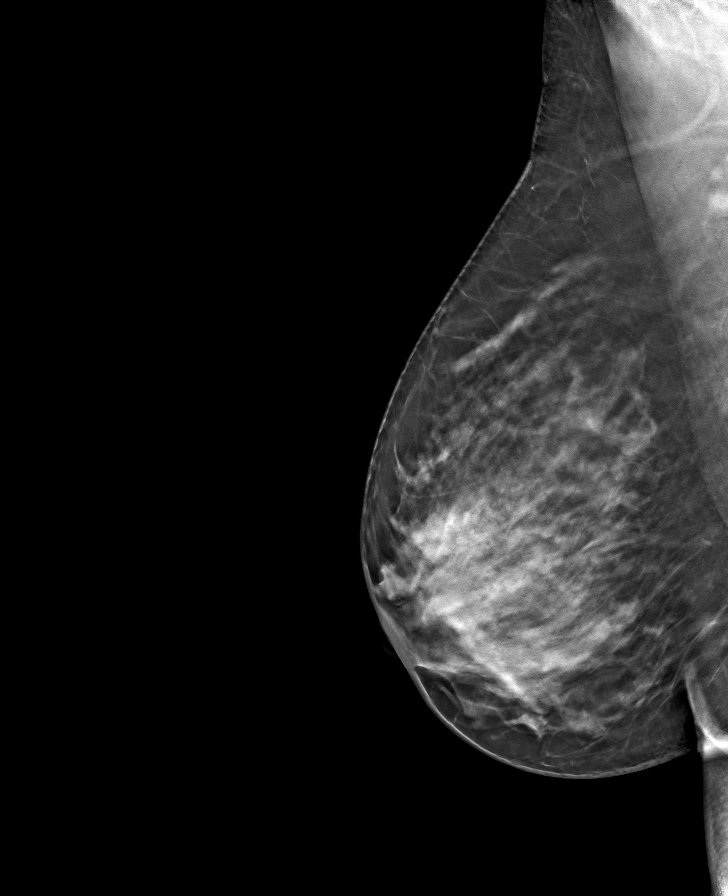

[L MLO tomo · tomo slice 38/75.0]
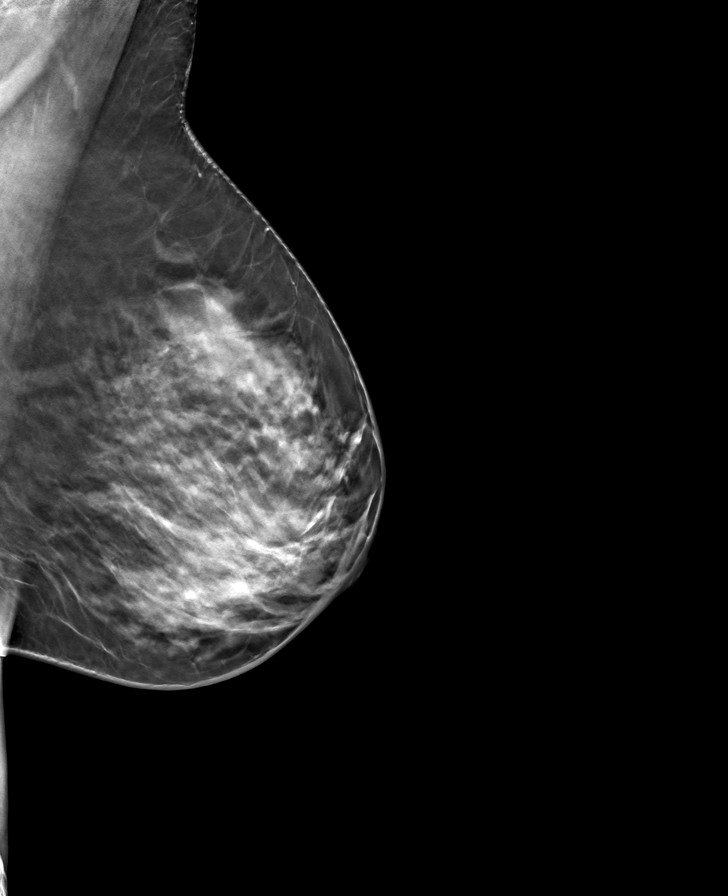

[R CC tomo · tomo slice 33/66.0]
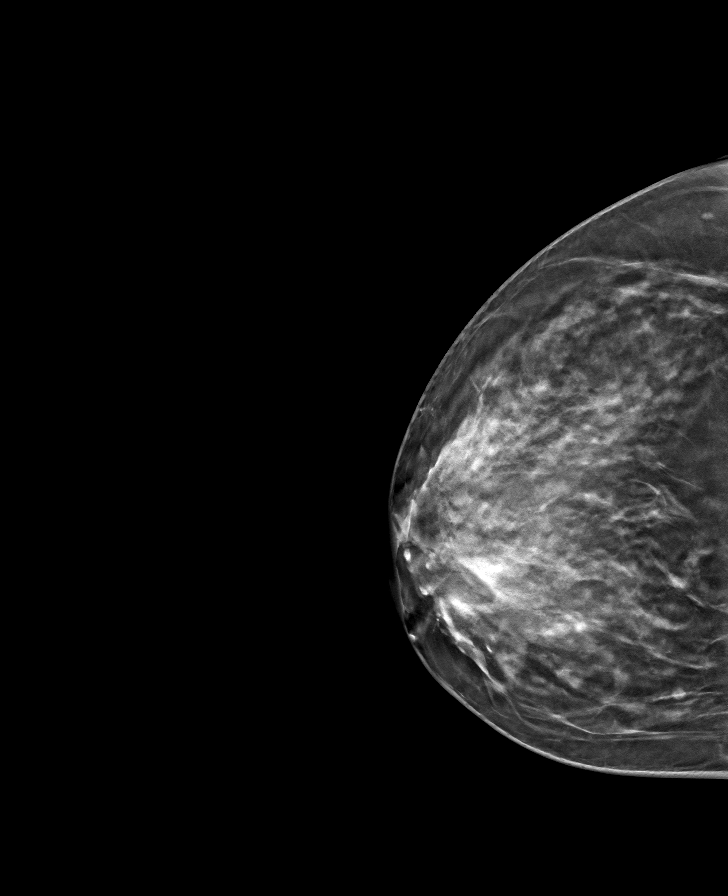

[L CC tomo · tomo slice 35/68.0]
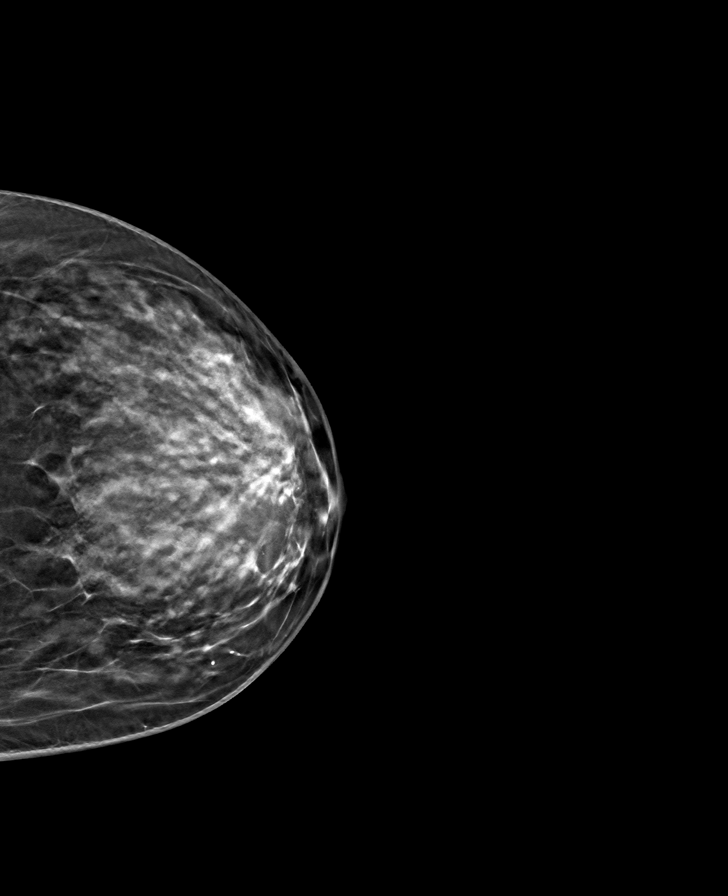

[8 of 24 positions shown; findings below may reference images not displayed]

ACR Breast Density Category c: The breast tissue is heterogeneously
dense, which may obscure small masses.
FINDINGS: There are no findings suspicious for malignancy. The images were
evaluated with computer-aided detection.
IMPRESSION: No mammographic evidence of malignancy. A result letter of this
screening mammogram will be mailed directly to the patient.

RECOMMENDATION:
Screening mammogram in one year. (Code:GX-G-HJU)

BI-RADS CATEGORY  1: Negative.
